# Patient Record
Sex: Female | Born: 1995
Health system: Southern US, Community
[De-identification: ages and names within clinical notes are randomized; demographics above are authoritative.]

## PROBLEM LIST (undated history)

## (undated) DIAGNOSIS — F988 Other specified behavioral and emotional disorders with onset usually occurring in childhood and adolescence: Secondary | ICD-10-CM

## (undated) DIAGNOSIS — G43909 Migraine, unspecified, not intractable, without status migrainosus: Secondary | ICD-10-CM

## (undated) DIAGNOSIS — N946 Dysmenorrhea, unspecified: Secondary | ICD-10-CM

## (undated) DIAGNOSIS — D473 Essential (hemorrhagic) thrombocythemia: Secondary | ICD-10-CM

## (undated) DIAGNOSIS — J45909 Unspecified asthma, uncomplicated: Secondary | ICD-10-CM

## (undated) DIAGNOSIS — D219 Benign neoplasm of connective and other soft tissue, unspecified: Secondary | ICD-10-CM

## (undated) HISTORY — PX: OTHER SURGICAL HISTORY: SHX169

## (undated) HISTORY — DX: Dysmenorrhea, unspecified: N94.6

## (undated) HISTORY — DX: Unspecified asthma, uncomplicated: J45.909

## (undated) HISTORY — PX: ADENOIDECTOMY: SUR15

## (undated) HISTORY — DX: Migraine, unspecified, not intractable, without status migrainosus: G43.909

## (undated) HISTORY — DX: Other specified behavioral and emotional disorders with onset usually occurring in childhood and adolescence: F98.8

## (undated) HISTORY — PX: WISDOM TOOTH EXTRACTION: SHX21

## (undated) HISTORY — PX: TONSILLECTOMY AND ADENOIDECTOMY: SUR1326

## (undated) HISTORY — DX: Benign neoplasm of connective and other soft tissue, unspecified: D21.9

## (undated) HISTORY — DX: Essential (hemorrhagic) thrombocythemia: D47.3

---
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
## 2015-03-06 ENCOUNTER — Encounter: Payer: Self-pay | Admitting: Nurse Practitioner

## 2015-03-06 ENCOUNTER — Ambulatory Visit (INDEPENDENT_AMBULATORY_CARE_PROVIDER_SITE_OTHER): Payer: BLUE CROSS/BLUE SHIELD | Admitting: Nurse Practitioner

## 2015-03-06 VITALS — BP 112/72 | HR 70 | Resp 16 | Ht 60.75 in | Wt 118.0 lb

## 2015-03-06 DIAGNOSIS — Z01419 Encounter for gynecological examination (general) (routine) without abnormal findings: Secondary | ICD-10-CM

## 2015-03-06 DIAGNOSIS — Z0000 Encounter for general adult medical examination without abnormal findings: Secondary | ICD-10-CM | POA: Diagnosis not present

## 2015-03-06 LAB — HEMOGLOBIN, FINGERSTICK: Hemoglobin, fingerstick: 12.6 g/dL (ref 12.0–16.0)

## 2015-03-06 MED ORDER — IBUPROFEN 800 MG PO TABS: 800.0000 mg | ORAL_TABLET | Freq: Three times a day (TID) | ORAL | Status: DC | PRN

## 2015-03-06 NOTE — Patient Instructions (Signed)
General topics  Next pap or exam is  due in 1 year Take a Women's multivitamin Take 1200 mg. of calcium daily - prefer dietary If any concerns in interim to call back  Breast Self-Awareness Practicing breast self-awareness may pick up problems early, prevent significant medical complications, and possibly save your life. By practicing breast self-awareness, you can become familiar with how your breasts look and feel and if your breasts are changing. This allows you to notice changes early. It can also offer you some reassurance that your breast health is good. One way to learn what is normal for your breasts and whether your breasts are changing is to do a breast self-exam. If you find a lump or something that was not present in the past, it is best to contact your caregiver right away. Other findings that should be evaluated by your caregiver include nipple discharge, especially if it is bloody; skin changes or reddening; areas where the skin seems to be pulled in (retracted); or new lumps and bumps. Breast pain is seldom associated with cancer (malignancy), but should also be evaluated by a caregiver. BREAST SELF-EXAM The best time to examine your breasts is 5 7 days after your menstrual period is over.  ExitCare Patient Information 2013 [employer].   Exercise to Stay Healthy Exercise helps you become and stay healthy. EXERCISE IDEAS AND TIPS Choose exercises that:  You enjoy.  Fit into your day. You do not need to exercise really hard to be healthy. You can do exercises at a slow or medium level and stay healthy. You can:  Stretch before and after working out.  Try yoga, Pilates, or tai chi.  Lift weights.  Walk fast, swim, jog, run, climb stairs, bicycle, dance, or rollerskate.  Take aerobic classes. Exercises that burn about 150 calories:  Running 1  miles in 15 minutes.  Playing volleyball for 45 to 60 minutes.  Washing and waxing a car for 45 to 60  minutes.  Playing touch football for 45 minutes.  Walking 1  miles in 35 minutes.  Pushing a stroller 1  miles in 30 minutes.  Playing basketball for 30 minutes.  Raking leaves for 30 minutes.  Bicycling 5 miles in 30 minutes.  Walking 2 miles in 30 minutes.  Dancing for 30 minutes.  Shoveling snow for 15 minutes.  Swimming laps for 20 minutes.  Walking up stairs for 15 minutes.  Bicycling 4 miles in 15 minutes.  Gardening for 30 to 45 minutes.  Jumping rope for 15 minutes.  Washing windows or floors for 45 to 60 minutes. Document Released: 01/16/2011 Document Revised: 03/07/2012 Document Reviewed: 01/16/2011 Exit[employer] Patient Information 2013 [employer].   Other topics ( that may be useful information):    Sexually Transmitted Disease Sexually transmitted disease (STD) refers to any infection that is passed from person to person during sexual activity. This may happen by way of saliva, semen, blood, vaginal mucus, or urine. Common STDs include:  Gonorrhea.  Chlamydia.  Syphilis.  HIV/AIDS.  Genital herpes.  Hepatitis B and C.  Trichomonas.  Human papillomavirus (HPV).  Pubic lice. CAUSES  An STD may be spread by bacteria, virus, or parasite. A person can get an STD by:  Sexual intercourse with an infected person.  Sharing sex toys with an infected person.  Sharing needles with an infected person.  Having intimate contact with the genitals, mouth, or rectal areas of an infected person. SYMPTOMS  Some people may not have any symptoms, but   they can still pass the infection to others. Different STDs have different symptoms. Symptoms include:  Painful or bloody urination.  Pain in the pelvis, abdomen, vagina, anus, throat, or eyes.  Skin rash, itching, irritation, growths, or sores (lesions). These usually occur in the genital or anal area.  Abnormal vaginal discharge.  Penile discharge in men.  Soft, flesh-colored skin growths in the  genital or anal area.  Fever.  Pain or bleeding during sexual intercourse.  Swollen glands in the groin area.  Yellow skin and eyes (jaundice). This is seen with hepatitis. DIAGNOSIS  To make a diagnosis, your caregiver may:  Take a medical history.  Perform a physical exam.  Take a specimen (culture) to be examined.  Examine a sample of discharge under a microscope.  Perform blood test TREATMENT   Chlamydia, gonorrhea, trichomonas, and syphilis can be cured with antibiotic medicine.  Genital herpes, hepatitis, and HIV can be treated, but not cured, with prescribed medicines. The medicines will lessen the symptoms.  Genital warts from HPV can be treated with medicine or by freezing, burning (electrocautery), or surgery. Warts may come back.  HPV is a virus and cannot be cured with medicine or surgery.However, abnormal areas may be followed very closely by your caregiver and may be removed from the cervix, vagina, or vulva through office procedures or surgery. If your diagnosis is confirmed, your recent sexual partners need treatment. This is true even if they are symptom-free or have a negative culture or evaluation. They should not have sex until their caregiver says it is okay. HOME CARE INSTRUCTIONS  All sexual partners should be informed, tested, and treated for all STDs.  Take your antibiotics as directed. Finish them even if you start to feel better.  Only take over-the-counter or prescription medicines for pain, discomfort, or fever as directed by your caregiver.  Rest.  Eat a balanced diet and drink enough fluids to keep your urine clear or pale yellow.  Do not have sex until treatment is completed and you have followed up with your caregiver. STDs should be checked after treatment.  Keep all follow-up appointments, Pap tests, and blood tests as directed by your caregiver.  Only use latex condoms and water-soluble lubricants during sexual activity. Do not use  petroleum jelly or oils.  Avoid alcohol and illegal drugs.  Get vaccinated for HPV and hepatitis. If you have not received these vaccines in the past, talk to your caregiver about whether one or both might be right for you.  Avoid risky sex practices that can break the skin. The only way to avoid getting an STD is to avoid all sexual activity.Latex condoms and dental dams (for oral sex) will help lessen the risk of getting an STD, but will not completely eliminate the risk. SEEK MEDICAL CARE IF:   You have a fever.  You have any new or worsening symptoms. Document Released: 03/06/2003 Document Revised: 03/07/2012 Document Reviewed: 03/13/2011 [facility] Patient Information 2013 [employer].    Domestic Abuse You are being battered or abused if someone close to you hits, pushes, or physically hurts you in any way. You also are being abused if you are forced into activities. You are being sexually abused if you are forced to have sexual contact of any kind. You are being emotionally abused if you are made to feel worthless or if you are constantly threatened. It is important to remember that help is available. No one has the right to abuse you. PREVENTION OF FURTHER  ABUSE  Learn the warning signs of danger. This varies with situations but may include: the use of alcohol, threats, isolation from friends and family, or forced sexual contact. Leave if you feel that violence is going to occur.  If you are attacked or beaten, report it to the police so the abuse is documented. You do not have to press charges. The police can protect you while you or the attackers are leaving. Get the officer's name and badge number and a copy of the report.  Find someone you can trust and tell them what is happening to you: your caregiver, a nurse, clergy member, close friend or family member. Feeling ashamed is natural, but remember that you have done nothing wrong. No one deserves abuse. Document Released:  12/11/2000 Document Revised: 03/07/2012 Document Reviewed: 02/19/2011 [employer] Patient Information 2013 [employer].    How Much is Too Much Alcohol? Drinking too much alcohol can cause injury, accidents, and health problems. These types of problems can include:   Car crashes.  Falls.  Family fighting (domestic violence).  Drowning.  Fights.  Injuries.  Burns.  Damage to certain organs.  Having a baby with birth defects. ONE DRINK CAN BE TOO MUCH WHEN YOU ARE:  Working.  Pregnant or breastfeeding.  Taking medicines. Ask your doctor.  Driving or planning to drive. If you or someone you know has a drinking problem, get help from a doctor.  Document Released: 10/10/2009 Document Revised: 03/07/2012 Document Reviewed: 10/10/2009 Exit[employer] Patient Information 2013 [employer].   Smoking Hazards Smoking cigarettes is extremely bad for your health. Tobacco smoke has over 200 known poisons in it. There are over 60 chemicals in tobacco smoke that cause cancer. Some of the chemicals found in cigarette smoke include:   Cyanide.  Benzene.  Formaldehyde.  Methanol (wood alcohol).  Acetylene (fuel used in welding torches).  Ammonia. Cigarette smoke also contains the poisonous gases nitrogen oxide and carbon monoxide.  Cigarette smokers have an increased risk of many serious medical problems and Smoking causes approximately:  90% of all lung cancer deaths in men.  80% of all lung cancer deaths in women.  90% of deaths from chronic obstructive lung disease. Compared with nonsmokers, smoking increases the risk of:  Coronary heart disease by 2 to 4 times.  Stroke by 2 to 4 times.  Men developing lung cancer by 23 times.  Women developing lung cancer by 13 times.  Dying from chronic obstructive lung diseases by 12 times.  . Smoking is the most preventable cause of death and disease in our society.  WHY IS SMOKING ADDICTIVE?  Nicotine is the chemical  agent in tobacco that is capable of causing addiction or dependence.  When you smoke and inhale, nicotine is absorbed rapidly into the bloodstream through your lungs. Nicotine absorbed through the lungs is capable of creating a powerful addiction. Both inhaled and non-inhaled nicotine may be addictive.  Addiction studies of cigarettes and spit tobacco show that addiction to nicotine occurs mainly during the teen years, when young people begin using tobacco products. WHAT ARE THE BENEFITS OF QUITTING?  There are many health benefits to quitting smoking.   Likelihood of developing cancer and heart disease decreases. Health improvements are seen almost immediately.  Blood pressure, pulse rate, and breathing patterns start returning to normal soon after quitting. QUITTING SMOKING   American Lung Association - 1-800-LUNGUSA  American Cancer Society - 1-800-ACS-2345 Document Released: 01/21/2005 Document Revised: 03/07/2012 Document Reviewed: 09/25/2009 Exit[employer] Patient Information 2013 [employer].   Stress Management Stress is a state of physical or mental tension that often results from changes in your life or normal routine. Some common causes of stress are:  Death of a loved one.  Injuries or severe illnesses.  Getting fired or changing jobs.  Moving into a new home. Other causes may be:  Sexual problems.  Business or financial losses.  Taking on a large debt.  Regular conflict with someone at home or at work.  Constant tiredness from lack of sleep. It is not just bad things that are stressful. It may be stressful to:  Win the lottery.  Get married.  Buy a new car. The amount of stress that can be easily tolerated varies from person to person. Changes generally cause stress, regardless of the types of change. Too much stress can affect your health. It may lead to physical or emotional problems. Too little stress (boredom) may also become stressful. SUGGESTIONS TO  REDUCE STRESS:  Talk things over with your family and friends. It often is helpful to share your concerns and worries. If you feel your problem is serious, you may want to get help from a professional counselor.  Consider your problems one at a time instead of lumping them all together. Trying to take care of everything at once may seem impossible. List all the things you need to do and then start with the most important one. Set a goal to accomplish 2 or 3 things each day. If you expect to do too many in a single day you will naturally fail, causing you to feel even more stressed.  Do not use alcohol or drugs to relieve stress. Although you may feel better for a short time, they do not remove the problems that caused the stress. They can also be habit forming.  Exercise regularly - at least 3 times per week. Physical exercise can help to relieve that "uptight" feeling and will relax you.  The shortest distance between despair and hope is often a good night's sleep.  Go to bed and get up on time allowing yourself time for appointments without being rushed.  Take a short "time-out" period from any stressful situation that occurs during the day. Close your eyes and take some deep breaths. Starting with the muscles in your face, tense them, hold it for a few seconds, then relax. Repeat this with the muscles in your neck, shoulders, hand, stomach, back and legs.  Take good care of yourself. Eat a balanced diet and get plenty of rest.  Schedule time for having fun. Take a break from your daily routine to relax. HOME CARE INSTRUCTIONS   Call if you feel overwhelmed by your problems and feel you can no longer manage them on your own.  Return immediately if you feel like hurting yourself or someone else. Document Released: 06/09/2001 Document Revised: 03/07/2012 Document Reviewed: 01/30/2008 ExitCare Patient Information 2013 [employer].  

## 2015-03-06 NOTE — Progress Notes (Signed)
19 y.o. G0 Single  African American Fe here for NGYN consult with mother who is a pt. here.    Menarche at age 50.   Now worsening cramps for about 3 -4 months.  Menses are 27-29 days.  Lasting 7 days.  Heavy for 3 days using a regular tampon changing every 1-1.5 hours.  Cramps 2 days prior to cycle and no help with OTC NSAID's. Some PMS.  Diagnosed with migraine headaches without aura.  She is much better since on Topamax.  HA's are not always related to menses.  Migraines since junior is HS and has been evaluated by neurologist.  She has never been SA.  This is second year in college and lives in apt. with 3 other females.  Patient's last menstrual period was 02/09/2015.          Sexually active: No.  The current method of family planning is abstinence.    Exercising: No.  exercise Smoker:  no  Health Maintenance: Pap:  none TDaP:  Up to date Gardasil:  Finished age 78 Labs: Hgb-12.6 Self breast exam: not done   reports that she has never smoked. She does not have any smokeless tobacco history on file. She reports that she does not drink alcohol or use illicit drugs.  Past Medical History  Diagnosis Date  . Dysmenorrhea   . Asthma   . Migraines   . ADD (attention deficit disorder) 2013    Past Surgical History  Procedure Laterality Date  . Tonsillectomy and adenoidectomy    . Other surgical history      hematoma surgery on head as an infant  . Wisdom tooth extraction      Current Outpatient Prescriptions  Medication Sig Dispense Refill  . EPINEPHrine 0.3 mg/0.3 mL IJ SOAJ injection     . lansoprazole (PREVACID) 30 MG capsule Take 30 mg by mouth.    . levocetirizine (XYZAL) 5 MG tablet   1  . methylphenidate 18 MG PO CR tablet   0  . montelukast (SINGULAIR) 10 MG tablet   1  . NASONEX 50 MCG/ACT nasal spray   2  . rizatriptan (MAXALT-MLT) 10 MG disintegrating tablet Take 10 mg by mouth.    . topiramate (TOPAMAX) 100 MG tablet Take 100 mg by mouth.    . tretinoin (RETIN-A)  0.025 % cream     . XOPENEX HFA 45 MCG/ACT inhaler   0  . ibuprofen (ADVIL,MOTRIN) 800 MG tablet Take 1 tablet (800 mg total) by mouth every 8 (eight) hours as needed. 30 tablet 2   No current facility-administered medications for this visit.    Family History  Problem Relation Age of Onset  . Diabetes Mother   . Breast cancer Maternal Aunt   . Colon cancer Maternal Grandmother   . Diabetes Maternal Grandmother   . Lung cancer Maternal Grandfather   . Stroke Paternal Grandmother   . Pancreatic cancer Paternal Uncle     ROS:  Pertinent items are noted in HPI.  Otherwise, a comprehensive ROS was negative.  Exam:   BP 112/72 mmHg  Pulse 70  Resp 16  Ht 5' 0.75" (1.543 m)  Wt 118 lb (53.524 kg)  BMI 22.48 kg/m2  LMP 02/09/2015 Height: 5' 0.75" (154.3 cm) Ht Readings from Last 3 Encounters:  03/06/15 5' 0.75" (1.543 m) (8 %*, Z = -1.39)   * Growth percentiles are based on CDC 2-20 Years data.    General appearance: alert, cooperative and appears stated age Neurologic:  Grossly normal  No physical exam at this time. Pelvic:  No pelvic exam is done today    A:  History of dysmenorrhea and PMS  Never SA  History of ADD  P:   Reviewed health and wellness pertinent to exam  Discussed all hormonal ways to resolve menses flow and lessen dysmenorrhea.  We have discussed OCP, Nexplanon, IUD, Nuva ring.  She is not interested in any hormonal intervention at this time.  She is willing to try Motrin 800 mg tid prn for pain #30/2 rf  She is wiling to try increase of Vit B and B complex at CD # 14 -28 for PMS.  If she decided on other intervention or need for birth control then she will return and further discuss best option for her.   Counseled on STD prevention, HIV risk factors and prevention, adequate intake of calcium and vitamin D, diet and exercise return annually or prn  An After Visit Summary was printed and given to the patient.   Consult time with patient and mother at  25 minutes face to face.

## 2015-03-10 NOTE — Progress Notes (Signed)
Encounter reviewed by Dr. Brook Silva.  

## 2015-07-11 DIAGNOSIS — L219 Seborrheic dermatitis, unspecified: Secondary | ICD-10-CM | POA: Insufficient documentation

## 2015-12-18 ENCOUNTER — Ambulatory Visit (INDEPENDENT_AMBULATORY_CARE_PROVIDER_SITE_OTHER): Payer: BLUE CROSS/BLUE SHIELD | Admitting: Allergy and Immunology

## 2015-12-18 ENCOUNTER — Encounter: Payer: Self-pay | Admitting: Allergy and Immunology

## 2015-12-18 VITALS — BP 108/74 | HR 80 | Resp 20

## 2015-12-18 DIAGNOSIS — T7800XD Anaphylactic reaction due to unspecified food, subsequent encounter: Secondary | ICD-10-CM | POA: Diagnosis not present

## 2015-12-18 DIAGNOSIS — J4530 Mild persistent asthma, uncomplicated: Secondary | ICD-10-CM

## 2015-12-18 DIAGNOSIS — J3089 Other allergic rhinitis: Secondary | ICD-10-CM | POA: Diagnosis not present

## 2015-12-18 DIAGNOSIS — T7800XA Anaphylactic reaction due to unspecified food, initial encounter: Secondary | ICD-10-CM | POA: Insufficient documentation

## 2015-12-18 DIAGNOSIS — K219 Gastro-esophageal reflux disease without esophagitis: Secondary | ICD-10-CM | POA: Diagnosis not present

## 2015-12-18 MED ORDER — LEVOCETIRIZINE DIHYDROCHLORIDE 5 MG PO TABS: 5.0000 mg | ORAL_TABLET | Freq: Every day | ORAL | Status: DC

## 2015-12-18 MED ORDER — AZELASTINE-FLUTICASONE 137-50 MCG/ACT NA SUSP: NASAL | Status: DC

## 2015-12-18 MED ORDER — MONTELUKAST SODIUM 10 MG PO TABS: 10.0000 mg | ORAL_TABLET | Freq: Every day | ORAL | Status: DC

## 2015-12-18 MED ORDER — DEXLANSOPRAZOLE 30 MG PO CPDR: 30.0000 mg | DELAYED_RELEASE_CAPSULE | Freq: Every day | ORAL | Status: DC

## 2015-12-18 MED ORDER — LEVALBUTEROL HCL 1.25 MG/3ML IN NEBU: 1.2500 mg | INHALATION_SOLUTION | RESPIRATORY_TRACT | Status: DC | PRN

## 2015-12-18 MED ORDER — EPINEPHRINE 0.3 MG/0.3ML IJ SOAJ: 0.3000 mg | Freq: Once | INTRAMUSCULAR | Status: DC

## 2015-12-18 MED ORDER — XOPENEX HFA 45 MCG/ACT IN AERO: INHALATION_SPRAY | RESPIRATORY_TRACT | Status: DC

## 2015-12-18 NOTE — Assessment & Plan Note (Addendum)
   A prescription has been provided for Dexilant 30 mg daily.  Continue appropriate lifestyle modifications.

## 2015-12-18 NOTE — Patient Instructions (Addendum)
Allergic rhinitis  A sample and prescription have been provided for Dymista (azelastine/fluticasone) nasal spray, 1-2 spray per nostril twice daily as needed. Proper nasal spray technique has been discussed and demonstrated.  Nasal saline lavage (NeilMed) as needed has been recommended along with instructions for proper administration.  Guaifenesin 1200 mg (plus/minus pseudoephedrine 120 mg) twice daily as needed with adequate hydration. Pseudoephedrine is only to be used for short-term relief of nasal/sinus congestion. Long-term use is discouraged due to potential side effects.   Mild persistent asthma Well-controlled.  Continue montelukast 10 mg daily bedtime and levalbuterol every 4-6 hours as needed and 15 minutes prior to vigorous exercise.  Subjective and objective measures of pulmonary function will be followed and the treatment plan will be adjusted accordingly.  GERD (gastroesophageal reflux disease)  A prescription has been provided for Dexilant 30 mg daily.  Continue appropriate lifestyle modifications.  Food allergy  Continue meticulous avoidance of peanuts and have access to epinephrine autoinjector 2 pack in case of accidental ingestion.    Return in about 6 months (around 06/17/2016), or if symptoms worsen or fail to improve.

## 2015-12-18 NOTE — Progress Notes (Signed)
RE: NAEEMAH Simpson MRN: [id number] DOB: [birth year] [facility] [place] [address] [place] [address] Date of Office Visit: 12/18/2015  Referring provider: Gavin Pound, MD [place], [place] [address]  History of present illness: HPI Comments: Stephanie Simpson is a 19 y.o. female With persistent asthma, allergic rhinitis, gastroesophageal reflux, and food allergies who presents today for follow up.  She complains of frequent nasal congestion, sinus pressure, and postnasal drainage.  She states that these symptoms used to only be problematic in the springtime in the fall, however now they occur year around.  The nasal and sinus symptoms seem to be worse when she wakes up in the morning.  She is compliant with multiple medications and was on aeroallergen immunotherapy for 7 years.  She reports that her asthma has been well controlled.  Over the past several months she has not required rescue medication, experienced nocturnal awakenings due to lower respiratory symptoms, nor have activities of daily living been limited.  She avoids peanuts and has access to epinephrine autoinjector.  She has been experiencing reflux symptoms approximately every other day despite compliance with her proton pump inhibitor and appropriate lifestyle modifications.   Assessment and plan: Allergic rhinitis  A sample and prescription have been provided for Dymista (azelastine/fluticasone) nasal spray, 1-2 spray per nostril twice daily as needed. Proper nasal spray technique has been discussed and demonstrated.  Nasal saline lavage (NeilMed) as needed has been recommended along with instructions for proper administration.  Guaifenesin 1200 mg (plus/minus pseudoephedrine 120 mg) twice daily as needed with adequate hydration. Pseudoephedrine is only to be used for short-term relief of nasal/sinus congestion. Long-term use is discouraged due to potential  side effects.   GERD (gastroesophageal reflux disease)  A prescription has been provided for Dexilant 30 mg daily.  Continue appropriate lifestyle modifications.  Mild persistent asthma Well-controlled.  Continue montelukast 10 mg daily bedtime and levalbuterol every 4-6 hours as needed and 15 minutes prior to vigorous exercise.  Subjective and objective measures of pulmonary function will be followed and the treatment plan will be adjusted accordingly.  Food allergy  Continue meticulous avoidance of peanuts and have access to epinephrine autoinjector 2 pack in case of accidental ingestion.    Medications ordered this encounter: Meds ordered this encounter  Medications  . EPINEPHrine 0.3 mg/0.3 mL IJ SOAJ injection    Sig: Inject 0.3 mLs (0.3 mg total) into the muscle once. As needed for severe life-threatening allergic reaction    Dispense:  4 Device    Refill:  2  . levocetirizine (XYZAL) 5 MG tablet    Sig: Take 1 tablet (5 mg total) by mouth daily.    Dispense:  90 tablet    Refill:  1  . montelukast (SINGULAIR) 10 MG tablet    Sig: Take 1 tablet (10 mg total) by mouth at bedtime.    Dispense:  90 tablet    Refill:  1  . XOPENEX HFA 45 MCG/ACT inhaler    Sig: Every 4-6 hours as needed for cough or wheeze.    Dispense:  2 Inhaler    Refill:  1  . levalbuterol (XOPENEX) 1.25 MG/3ML nebulizer solution    Sig: Take 1.25 mg by nebulization every 4 (four) hours as needed for wheezing.    Dispense:  75 mL    Refill:  1  . Azelastine-Fluticasone 137-50 MCG/ACT SUSP    Sig: 1-2 sprays in each  nostril twice daily for stuffy nose or congestion    Dispense:  1 Bottle    Refill:  0  . Azelastine-Fluticasone 137-50 MCG/ACT SUSP    Sig: 1-2 sprays in each nostril twice daily for stuffy nose or congestion    Dispense:  3 Bottle    Refill:  1  . Dexlansoprazole (DEXILANT) 30 MG capsule    Sig: Take 1 capsule (30 mg total) by mouth daily.    Dispense:  30 capsule    Refill:   0  . Dexlansoprazole (DEXILANT) 30 MG capsule    Sig: Take 1 capsule (30 mg total) by mouth daily.    Dispense:  90 capsule    Refill:  1    Diagnositics: Spirometry reveals an FVC of 3.59 L and an FEV1 of 2.67 L (109% predicted).    Physical examination: Blood pressure 108/74, pulse 80, resp. rate 20.  General: Alert, interactive, in no acute distress. HEENT: TMs pearly gray, turbinates moderately edematous without discharge, post-pharynx moderately erythematous. Neck: Supple without lymphadenopathy. Lungs: Clear to auscultation without wheezing, rhonchi or rales. CV: Normal S1, S2 without murmurs. Skin: Warm and dry, without lesions or rashes.  The following portions of the patient's history were reviewed and updated as appropriate: allergies, current medications, past family history, past medical history, past social history, past surgical history and problem list.  Outpatient medications:   Medication List       This list is accurate as of: 12/18/15  6:57 PM.  Always use your most recent med list.               Azelastine-Fluticasone 137-50 MCG/ACT Susp  1-2 sprays in each nostril twice daily for stuffy nose or congestion     Azelastine-Fluticasone 137-50 MCG/ACT Susp  1-2 sprays in each nostril twice daily for stuffy nose or congestion     Dexlansoprazole 30 MG capsule  Commonly known as:  DEXILANT  Take 1 capsule (30 mg total) by mouth daily.     Dexlansoprazole 30 MG capsule  Commonly known as:  DEXILANT  Take 1 capsule (30 mg total) by mouth daily.     EPINEPHrine 0.3 mg/0.3 mL Soaj injection  Commonly known as:  EPI-PEN  Inject 0.3 mLs (0.3 mg total) into the muscle once. As needed for severe life-threatening allergic reaction     ibuprofen 800 MG tablet  Commonly known as:  ADVIL,MOTRIN  Take 1 tablet (800 mg total) by mouth every 8 (eight) hours as needed.     lansoprazole 30 MG capsule  Commonly known as:  PREVACID  Take 30 mg by mouth.      levalbuterol 1.25 MG/3ML nebulizer solution  Commonly known as:  XOPENEX  Take 1 ampule by nebulization every 4 (four) hours as needed.     levalbuterol 1.25 MG/3ML nebulizer solution  Commonly known as:  XOPENEX  Take 1.25 mg by nebulization every 4 (four) hours as needed for wheezing.     levocetirizine 5 MG tablet  Commonly known as:  XYZAL  Take 1 tablet (5 mg total) by mouth daily.     methylphenidate 18 MG CR tablet  Commonly known as:  CONCERTA  Take 18 mg by mouth daily.     montelukast 10 MG tablet  Commonly known as:  SINGULAIR  Take 1 tablet (10 mg total) by mouth at bedtime.     NASONEX 50 MCG/ACT nasal spray  Generic drug:  mometasone  Place 1 spray into the nose 2 (two) times  daily.     rizatriptan 10 MG disintegrating tablet  Commonly known as:  MAXALT-MLT  Take 10 mg by mouth as needed for migraine.     topiramate 100 MG tablet  Commonly known as:  TOPAMAX  Take 100 mg by mouth daily.     tretinoin 0.025 % cream  Commonly known as:  RETIN-A  Reported on 12/18/2015     XOPENEX HFA 45 MCG/ACT inhaler  Generic drug:  levalbuterol  Every 4-6 hours as needed for cough or wheeze.        Known medication allergies: Allergies  Allergen Reactions  . Amoxicillin-Pot Clavulanate Hives  . Doxycycline Hives  . Peanut Oil     Unsure, told she was allergic per allergist  . Peanut-Containing Drug Products    Review of systems: Constitutional: Negative for fever, chills and weight loss.  HENT: Negative for nosebleeds.   Positive for nasal congestion and sinus pressure. Eyes: Negative for blurred vision.  Respiratory: Negative for hemoptysis.   Cardiovascular: Negative for chest pain.  Gastrointestinal: Negative for diarrhea and constipation.  Genitourinary: Negative for dysuria.  Musculoskeletal: Negative for myalgias and joint pain.  Neurological: Negative for dizziness.  Endo/Heme/Allergies: Does not bruise/bleed easily.   Past Medical History   Diagnosis Date  . Dysmenorrhea   . Asthma   . Migraines   . ADD (attention deficit disorder) 2013    Family History  Problem Relation Age of Onset  . Diabetes Mother   . Breast cancer Maternal Aunt   . Colon cancer Maternal Grandmother   . Diabetes Maternal Grandmother   . Lung cancer Maternal Grandfather   . Stroke Paternal Grandmother   . Pancreatic cancer Paternal Uncle     Social History   Social History  . Marital Status: Single    Spouse Name: N/A  . Number of Children: N/A  . Years of Education: N/A   Occupational History  . Not on file.   Social History Main Topics  . Smoking status: Never Smoker   . Smokeless tobacco: Never Used  . Alcohol Use: No  . Drug Use: No  . Sexual Activity: No     Comment: never sexually active   Other Topics Concern  . Not on file   Social History Narrative    I appreciate the opportunity to take part in this Shaquana's care. Please do not hesitate to contact me with questions.  Sincerely,   R. Edgar Frisk, MD

## 2015-12-18 NOTE — Assessment & Plan Note (Signed)
Well-controlled.  Continue montelukast 10 mg daily bedtime and levalbuterol every 4-6 hours as needed and 15 minutes prior to vigorous exercise.  Subjective and objective measures of pulmonary function will be followed and the treatment plan will be adjusted accordingly.

## 2015-12-18 NOTE — Assessment & Plan Note (Signed)
   A sample and prescription have been provided for Dymista (azelastine/fluticasone) nasal spray, 1-2 spray per nostril twice daily as needed. Proper nasal spray technique has been discussed and demonstrated.  Nasal saline lavage (NeilMed) as needed has been recommended along with instructions for proper administration.  Guaifenesin 1200 mg (plus/minus pseudoephedrine 120 mg) twice daily as needed with adequate hydration. Pseudoephedrine is only to be used for short-term relief of nasal/sinus congestion. Long-term use is discouraged due to potential side effects.

## 2015-12-18 NOTE — Assessment & Plan Note (Signed)
   Continue meticulous avoidance of peanuts and have access to epinephrine autoinjector 2 pack in case of accidental ingestion.

## 2015-12-26 ENCOUNTER — Telehealth: Payer: Self-pay

## 2015-12-26 NOTE — Telephone Encounter (Signed)
Spoke with Mom Mariann Laster) and Stephanie Simpson (patient).  They states Keydi has tried and failed Nasonex, Flonase and Saline.

## 2015-12-26 NOTE — Telephone Encounter (Signed)
Called patient to find out what she has tried and failed to complete Dymista Prior Authorization.  Mom and Patient states has used Nasonex, Flonase, and Saline.

## 2015-12-26 NOTE — Telephone Encounter (Signed)
Tried to call Patient to see what nasal sprays she has tried and failed so I can complete a Prior Authorization for Dymista.  No answer.  Left message to call the office.

## 2016-01-24 ENCOUNTER — Other Ambulatory Visit: Payer: Self-pay | Admitting: Nurse Practitioner

## 2016-01-24 MED ORDER — IBUPROFEN 800 MG PO TABS: 800.0000 mg | ORAL_TABLET | Freq: Three times a day (TID) | ORAL | Status: DC | PRN

## 2016-03-03 ENCOUNTER — Ambulatory Visit: Payer: BLUE CROSS/BLUE SHIELD | Admitting: Nurse Practitioner

## 2016-04-19 ENCOUNTER — Other Ambulatory Visit: Payer: Self-pay | Admitting: Allergy and Immunology

## 2016-05-11 ENCOUNTER — Ambulatory Visit (INDEPENDENT_AMBULATORY_CARE_PROVIDER_SITE_OTHER): Payer: Managed Care, Other (non HMO) | Admitting: Pediatrics

## 2016-05-11 ENCOUNTER — Encounter: Payer: Self-pay | Admitting: Pediatrics

## 2016-05-11 VITALS — BP 110/68 | HR 80 | Temp 97.4°F | Resp 18

## 2016-05-11 DIAGNOSIS — T7800XD Anaphylactic reaction due to unspecified food, subsequent encounter: Secondary | ICD-10-CM

## 2016-05-11 DIAGNOSIS — J4530 Mild persistent asthma, uncomplicated: Secondary | ICD-10-CM

## 2016-05-11 DIAGNOSIS — K219 Gastro-esophageal reflux disease without esophagitis: Secondary | ICD-10-CM | POA: Insufficient documentation

## 2016-05-11 DIAGNOSIS — J3089 Other allergic rhinitis: Secondary | ICD-10-CM

## 2016-05-11 MED ORDER — LEVALBUTEROL HCL 1.25 MG/3ML IN NEBU: 1.2500 mg | INHALATION_SOLUTION | RESPIRATORY_TRACT | Status: DC | PRN

## 2016-05-11 MED ORDER — MONTELUKAST SODIUM 10 MG PO TABS: 10.0000 mg | ORAL_TABLET | Freq: Every day | ORAL | Status: DC

## 2016-05-11 MED ORDER — LEVOCETIRIZINE DIHYDROCHLORIDE 5 MG PO TABS: 5.0000 mg | ORAL_TABLET | Freq: Every day | ORAL | Status: DC

## 2016-05-11 MED ORDER — XOPENEX HFA 45 MCG/ACT IN AERO: INHALATION_SPRAY | RESPIRATORY_TRACT | Status: DC

## 2016-05-11 MED ORDER — PANTOPRAZOLE SODIUM 40 MG PO TBEC: DELAYED_RELEASE_TABLET | ORAL | Status: DC

## 2016-05-11 MED ORDER — EPINEPHRINE 0.3 MG/0.3ML IJ SOAJ: INTRAMUSCULAR | Status: DC

## 2016-05-11 MED ORDER — EPINEPHRINE 0.15 MG/0.3ML IJ SOAJ: INTRAMUSCULAR | Status: DC

## 2016-05-11 MED ORDER — FLUTICASONE PROPIONATE 50 MCG/ACT NA SUSP: NASAL | Status: DC

## 2016-05-11 NOTE — Progress Notes (Signed)
[address] [place] [address] Dept: [phone]  FOLLOW UP NOTE  Patient ID: Stephanie Simpson, female    DOB: 10-Apr-1996  Age: 20 y.o. MRN: [id number] Date of Office Visit: 05/11/2016  Assessment Chief Complaint: Sneezing; Nasal Congestion; and Eyes itch and water  HPI Reigna J Booze presents for follow-up of asthma, allergic rhinitis and food allergies. During the springtime the pollen has been bothering her greatly.. She is not on allergy injections any longer. She cannot afford the co-pay of Dymista ror Dexilant. She continues to avoid peanuts  Current medications are Benadryl and EpiPen 0.3 mg in case of an allergic reaction to peanut, montelukast  10 mg once a day, levocetirizine 5 mg once a day, Xopenex 2 puffs every 6 hours if needed or instead Xopenex 1.25 mg one unit dose every 6 hours if needed, Dexilant 30 mg once a day, Dymista one spray per nostril twice a day. Her other medications are outlined in the chart    Drug Allergies:  Allergies  Allergen Reactions  . Amoxicillin-Pot Clavulanate Hives  . Doxycycline Hives  . Peanut Oil     Unsure, told she was allergic per allergist  . Peanut-Containing Drug Products     Physical Exam: BP 110/68 mmHg  Pulse 80  Temp(Src) 97.4 F (36.3 C) (Oral)  Resp 18   Physical Exam  Constitutional: She is oriented to person, place, and time. She appears well-developed and well-nourished.  HENT:  Eyes normal. Ears normal. Nose mild swelling of nasal  turbinates with clear nasal discharge. Pharynx normal.  Neck: Neck supple.  Cardiovascular:  S1 and S2 normal no murmurs  Pulmonary/Chest:  Clear to percussion and auscultation  Lymphadenopathy:    She has no cervical adenopathy.  Neurological: She is alert and oriented to person, place, and time.  Skin:  Clear  Psychiatric: She has a normal mood and affect. Her behavior is normal. Judgment and thought content normal.  Vitals reviewed.   Diagnostics:  FVC 3.20 L FEV1  2.65 L. Predicted FVC 2.74 L predicted FEV1 2.44 L-the spirometry is in the normal range  Assessment and Plan: 1. Mild persistent asthma, uncomplicated   2. Other allergic rhinitis   3. Allergy with anaphylaxis due to food, subsequent encounter   4. Gastroesophageal reflux disease without esophagitis     Meds ordered this encounter  Medications  . DISCONTD: fluticasone (FLONASE) 50 MCG/ACT nasal spray    Sig: TWO SPRAYS EACH NOSTRIL ONCE A DAY FOR NASAL CONGESTION    Dispense:  48 g    Refill:  2  . DISCONTD: pantoprazole (PROTONIX) 40 MG tablet    Sig: ONE TABLET ONCE A DAY FOR REFLUX    Dispense:  90 tablet    Refill:  1  . DISCONTDPenne Lash HFA 45 MCG/ACT inhaler    Sig: Every 4-6 hours as needed for cough or wheeze.    Dispense:  3 Inhaler    Refill:  1  . DISCONTD: levalbuterol (XOPENEX) 1.25 MG/3ML nebulizer solution    Sig: Take 1.25 mg by nebulization every 4 (four) hours as needed for wheezing.    Dispense:  225 mL    Refill:  1  . DISCONTD: levocetirizine (XYZAL) 5 MG tablet    Sig: Take 1 tablet (5 mg total) by mouth daily.    Dispense:  90 tablet    Refill:  1  . DISCONTD: montelukast (SINGULAIR) 10 MG tablet    Sig: Take 1 tablet (10 mg total) by mouth  at bedtime.    Dispense:  90 tablet    Refill:  1  . DISCONTD: EPINEPHrine (EPIPEN JR 2-PAK) 0.15 MG/0.3ML injection    Sig: USE AS DIRECTED FOR SEVERE ALLERGIC REACTION.    Dispense:  3 each    Refill:  1  . fluticasone (FLONASE) 50 MCG/ACT nasal spray    Sig: TWO SPRAYS EACH NOSTRIL ONCE A DAY FOR NASAL CONGESTION    Dispense:  16 g    Refill:  0  . levalbuterol (XOPENEX) 1.25 MG/3ML nebulizer solution    Sig: Take 1.25 mg by nebulization every 4 (four) hours as needed for wheezing.    Dispense:  75 mL    Refill:  0  . levocetirizine (XYZAL) 5 MG tablet    Sig: Take 1 tablet (5 mg total) by mouth daily.    Dispense:  30 tablet    Refill:  0  . montelukast (SINGULAIR) 10 MG tablet    Sig: Take 1 tablet  (10 mg total) by mouth at bedtime.    Dispense:  30 tablet    Refill:  0  . pantoprazole (PROTONIX) 40 MG tablet    Sig: ONE TABLET ONCE A DAY FOR REFLUX    Dispense:  30 tablet    Refill:  0  . EPINEPHrine (EPIPEN 2-PAK) 0.3 mg/0.3 mL IJ SOAJ injection    Sig: USE AS DIRECTED FOR SEVERE ALLERGIC REACTION.    Dispense:  2 Device    Refill:  0    PT WILL CALL FOR FILL.  Marland Kitchen XOPENEX HFA 45 MCG/ACT inhaler    Sig: TWO PUFFS EVERY 6 HOURS IF NEEDED FOR COUGH OR WHEEZE.    Dispense:  1 Inhaler    Refill:  0    Patient Instructions  Zyrtec 10 mg once a day for runny nose or itchy eyes Fluticasone 2 sprays per nostril once a day for stuffy nose Pantoprazole 40 mg-one tablet once a day for reflux Xopenex 2 puffs every 6 hours if needed for coughing or wheezing Xopenex 1.25 mg per 3 ML-take one unit dose every  dose  6 hours if needed for coughing or wheezing Levocetirizine 5 mg once a day for runny nose Montelukast 10 mg once a day for coughing or wheezing Stop Dexilant and Dymista   Avoid peanuts. If she has an allergic reaction give Benadryl 50 mg every 4 hours and if she has life-threatening symptoms inject with EpiPen 0.3 mg    Return in about 6 months (around 11/11/2016).    Thank you for the opportunity to care for this patient.  Please do not hesitate to contact me with questions.  Penne Lash, M.D.  [facility] [address] [place], [place] [address] [phone]

## 2016-05-11 NOTE — Patient Instructions (Addendum)
Zyrtec 10 mg once a day for runny nose or itchy eyes Fluticasone 2 sprays per nostril once a day for stuffy nose Pantoprazole 40 mg-one tablet once a day for reflux Xopenex 2 puffs every 6 hours if needed for coughing or wheezing Xopenex 1.25 mg per 3 ML-take one unit dose every  dose  6 hours if needed for coughing or wheezing Levocetirizine 5 mg once a day for runny nose Montelukast 10 mg once a day for coughing or wheezing Stop Dexilant and Dymista   Avoid peanuts. If she has an allergic reaction give Benadryl 50 mg every 4 hours and if she has life-threatening symptoms inject with EpiPen 0.3 mg

## 2016-05-14 ENCOUNTER — Telehealth: Payer: Self-pay | Admitting: *Deleted

## 2016-05-14 ENCOUNTER — Other Ambulatory Visit: Payer: Self-pay | Admitting: Allergy

## 2016-05-14 MED ORDER — ALBUTEROL SULFATE HFA 108 (90 BASE) MCG/ACT IN AERS: 2.0000 | INHALATION_SPRAY | RESPIRATORY_TRACT | Status: DC | PRN

## 2016-05-14 NOTE — Telephone Encounter (Signed)
CALLED IN [facility]. AND LEFT MESSAGE FOR PATIENT

## 2016-05-14 NOTE — Telephone Encounter (Signed)
Call patient. Let her know that her insurance will not pay for Xopenex . We could try Pro-air 2 puffs every 4 hours if needed.

## 2016-05-14 NOTE — Telephone Encounter (Signed)
Pharmacy called and states insurance will not pay for Xopenex. Insurance will cover Ventolin HFA, ProAir HFA, ProAir Respclick. Please advise

## 2016-05-16 ENCOUNTER — Other Ambulatory Visit: Payer: Self-pay | Admitting: Allergy and Immunology

## 2016-05-28 ENCOUNTER — Telehealth: Payer: Self-pay | Admitting: Pediatrics

## 2016-05-28 NOTE — Telephone Encounter (Signed)
Her mother called with concerns about the charge for laboratory and wanted to know why the office visit charge was so much. If you call her and she doesn't answer please call her back tomorrow.

## 2016-10-08 ENCOUNTER — Ambulatory Visit (INDEPENDENT_AMBULATORY_CARE_PROVIDER_SITE_OTHER): Payer: BC Managed Care – PPO | Admitting: Allergy and Immunology

## 2016-10-08 ENCOUNTER — Encounter: Payer: Self-pay | Admitting: Allergy and Immunology

## 2016-10-08 VITALS — BP 110/70 | HR 68 | Temp 98.4°F | Resp 16 | Ht 60.83 in | Wt 118.2 lb

## 2016-10-08 DIAGNOSIS — Z23 Encounter for immunization: Secondary | ICD-10-CM

## 2016-10-08 DIAGNOSIS — J4530 Mild persistent asthma, uncomplicated: Secondary | ICD-10-CM | POA: Diagnosis not present

## 2016-10-08 DIAGNOSIS — K219 Gastro-esophageal reflux disease without esophagitis: Secondary | ICD-10-CM

## 2016-10-08 DIAGNOSIS — J3089 Other allergic rhinitis: Secondary | ICD-10-CM

## 2016-10-08 DIAGNOSIS — T7800XD Anaphylactic reaction due to unspecified food, subsequent encounter: Secondary | ICD-10-CM | POA: Diagnosis not present

## 2016-10-08 MED ORDER — BECLOMETHASONE DIPROPIONATE 80 MCG/ACT IN AERS
2.0000 | INHALATION_SPRAY | Freq: Two times a day (BID) | RESPIRATORY_TRACT | 5 refills | Status: DC
Start: 2016-10-08 — End: 2019-03-02

## 2016-10-08 NOTE — Assessment & Plan Note (Signed)
   Continue appropriate reflux lifestyle modifications and proton pump inhibitor as prescribed.

## 2016-10-08 NOTE — Patient Instructions (Addendum)
Mild persistent asthma  Continue montelukast 10 mg daily bedtime and albuterol HFA, 1-2 inhalations every 4-6 hours as needed.  During respiratory tract infections or asthma flares, add Qvar 80 g  to 2 inhalations 2 times per day until symptoms have returned to baseline.To maximize pulmonary deposition, a spacer has been provided along with instructions for its proper administration with an HFA inhaler.  Influenza vaccination was administered today.  Subjective and objective measures of pulmonary function will be followed and the treatment plan will be adjusted accordingly.  Allergic rhinitis  Continue appropriate allergen avoidance measures and Dymista, 1-2 sprays twice a day.  Nasal saline lavage (NeilMed) as needed has been recommended along with instructions for proper administration.  For thick post nasal drainage, add guaifenesin 1200 mg (Mucinex Maximum Strength)  twice daily as needed with adequate hydration as discussed.  If allergen avoidance measures and medications fail to adequately relieve symptoms, aeroallergen immunotherapy will be considered.  GERD (gastroesophageal reflux disease)  Continue appropriate reflux lifestyle modifications and proton pump inhibitor as prescribed.   Return in about 4 months (around 02/08/2017), or if symptoms worsen or fail to improve.

## 2016-10-08 NOTE — Progress Notes (Signed)
Follow-up Note  RE: Stephanie Simpson MRN: [id number] DOB: Sep 25, 1996 Date of Office Visit: 10/08/2016  Primary care provider: Gavin Pound, MD Referring provider: Gavin Pound, MD  History of present illness: Stephanie Simpson is a 20 y.o. female with persistent asthma, allergic rhinitis, and food allergy presenting today for sick visit.  She was last seen in this clinic on 05/11/2016.  She reports that approximately 3 weeks ago she developed an upper respiratory tract infection which turned into bronchitis/mild asthma exacerbation.  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  She reports that her symptoms have improved overall, however she is still experiencing postnasal drainage a mild lingering cough.  She has no complaints related acid reflux today.   Assessment and plan: Mild persistent asthma  Continue montelukast 10 mg daily bedtime and albuterol HFA, 1-2 inhalations every 4-6 hours as needed.  During respiratory tract infections or asthma flares, add Qvar 80 g  to 2 inhalations 2 times per day until symptoms have returned to baseline.To maximize pulmonary deposition, a spacer has been provided along with instructions for its proper administration with an HFA inhaler.  Influenza vaccination was administered today.  Subjective and objective measures of pulmonary function will be followed and the treatment plan will be adjusted accordingly.  Allergic rhinitis  Continue appropriate allergen avoidance measures and Dymista, 1-2 sprays twice a day.  Nasal saline lavage (NeilMed) as needed has been recommended along with instructions for proper administration.  For thick post nasal drainage, add guaifenesin 1200 mg (Mucinex Maximum Strength)  twice daily as needed with adequate hydration as discussed.  If allergen avoidance measures and medications fail to adequately relieve symptoms, aeroallergen immunotherapy will be  considered.  GERD (gastroesophageal reflux disease)  Continue appropriate reflux lifestyle modifications and proton pump inhibitor as prescribed.   Meds ordered this encounter  Medications  . beclomethasone (QVAR) 80 MCG/ACT inhaler    Sig: Inhale 2 puffs into the lungs 2 (two) times daily.    Dispense:  1 Inhaler    Refill:  5    Diagnostics: Spirometry:  Normal with an FEV1 of 101% predicted.  Please see scanned spirometry results for details.    Physical examination: Blood pressure 110/70, pulse 68, temperature 98.4 F (36.9 C), temperature source Oral, resp. rate 16, height 5' 0.83" (1.545 m), weight 118 lb 2.7 oz (53.6 kg), SpO2 97 %.  General: Alert, interactive, in no acute distress. HEENT: TMs pearly gray, turbinates moderately edematous without discharge, post-pharynx mildly erythematous. Neck: Supple without lymphadenopathy. Lungs: Clear to auscultation without wheezing, rhonchi or rales. CV: Normal S1, S2 without murmurs. Skin: Warm and dry, without lesions or rashes.  The following portions of the patient's history were reviewed and updated as appropriate: allergies, current medications, past family history, past medical history, past social history, past surgical history and problem list.    Medication List       Accurate as of 10/08/16  3:41 PM. Always use your most recent med list.          beclomethasone 80 MCG/ACT inhaler Commonly known as:  QVAR Inhale 2 puffs into the lungs 2 (two) times daily.   clindamycin-benzoyl peroxide gel Commonly known as:  BENZACLIN Apply to face daily to twice daily   DYMISTA 137-50 MCG/ACT Susp Generic drug:  Azelastine-Fluticasone INHALE 1 TO 2 SPRAYS IN EACH NOSTRIL TWICE DAILY FOR STUFFY NOSE OR CONGESTION   EPIPEN 2-PAK 0.3 mg/0.3 mL Soaj injection Generic  drug:  EPINEPHrine INJECT INTRAMUSCULARLY AS DIRECTED FOR SEVERE LIFE-THREATENING ALLERGIC REACTION.   frovatriptan 2.5 MG tablet Commonly known as:   FROVA Take by mouth.   ibuprofen 800 MG tablet Commonly known as:  ADVIL,MOTRIN Take 1 tablet (800 mg total) by mouth every 8 (eight) hours as needed.   lansoprazole 30 MG capsule Commonly known as:  PREVACID Take 30 mg by mouth. Reported on 05/11/2016   levalbuterol 1.25 MG/0.5ML nebulizer solution Commonly known as:  XOPENEX Take 1.25 mg by nebulization every 4 (four) hours as needed for wheezing or shortness of breath.   levalbuterol 45 MCG/ACT inhaler Commonly known as:  XOPENEX HFA Inhale into the lungs.   levocetirizine 5 MG tablet Commonly known as:  XYZAL Take 1 tablet (5 mg total) by mouth daily.   methylphenidate 27 MG CR tablet Commonly known as:  CONCERTA Take by mouth.   montelukast 10 MG tablet Commonly known as:  SINGULAIR TK 1 T PO ONCE D   zonisamide 100 MG capsule Commonly known as:  ZONEGRAN Take by mouth.       Allergies  Allergen Reactions  . Amoxicillin-Pot Clavulanate Hives and Other (See Comments)    No reaction noted  . Doxycycline Hives and Other (See Comments)    HIVES  . Peanut Oil Other (See Comments)    No reaction noted Unsure, told she was allergic per allergist  . Peanut-Containing Drug Products    Review of systems: Review of systems negative except as noted in HPI / PMHx or noted below: Constitutional: Negative.  HENT: Negative.   Eyes: Negative.  Respiratory: Negative.   Cardiovascular: Negative.  Gastrointestinal: Negative.  Genitourinary: Negative.  Musculoskeletal: Negative.  Neurological: Negative.  Endo/Heme/Allergies: Negative.  Cutaneous: Negative.  Past Medical History:  Diagnosis Date  . ADD (attention deficit disorder) 2013  . Asthma   . Dysmenorrhea   . Migraines     Family History  Problem Relation Age of Onset  . Diabetes Mother   . Colon cancer Maternal Grandmother   . Diabetes Maternal Grandmother   . Lung cancer Maternal Grandfather   . Stroke Paternal Grandmother   . Breast cancer  Maternal Aunt   . Pancreatic cancer Paternal Uncle     Social History   Social History  . Marital status: Single    Spouse name: N/A  . Number of children: N/A  . Years of education: N/A   Occupational History  . Not on file.   Social History Main Topics  . Smoking status: Never Smoker  . Smokeless tobacco: Never Used  . Alcohol use No  . Drug use: No  . Sexual activity: No     Comment: never sexually active   Other Topics Concern  . Not on file   Social History Narrative  . No narrative on file    I appreciate the opportunity to take part in Christena's care. Please do not hesitate to contact me with questions.  Sincerely,   R. Edgar Frisk, MD

## 2016-10-08 NOTE — Assessment & Plan Note (Addendum)
   Continue montelukast 10 mg daily bedtime and albuterol HFA, 1-2 inhalations every 4-6 hours as needed.  During respiratory tract infections or asthma flares, add Qvar 80 g  to 2 inhalations 2 times per day until symptoms have returned to baseline.To maximize pulmonary deposition, a spacer has been provided along with instructions for its proper administration with an HFA inhaler.  Influenza vaccination was administered today.  Subjective and objective measures of pulmonary function will be followed and the treatment plan will be adjusted accordingly.

## 2016-10-08 NOTE — Assessment & Plan Note (Addendum)
   Continue appropriate allergen avoidance measures and Dymista, 1-2 sprays twice a day.  Nasal saline lavage (NeilMed) as needed has been recommended along with instructions for proper administration.  For thick post nasal drainage, add guaifenesin 1200 mg (Mucinex Maximum Strength)  twice daily as needed with adequate hydration as discussed.  If allergen avoidance measures and medications fail to adequately relieve symptoms, aeroallergen immunotherapy will be considered.

## 2016-12-07 ENCOUNTER — Encounter: Payer: Self-pay | Admitting: Pediatrics

## 2016-12-07 ENCOUNTER — Ambulatory Visit (INDEPENDENT_AMBULATORY_CARE_PROVIDER_SITE_OTHER): Payer: BC Managed Care – PPO | Admitting: Pediatrics

## 2016-12-07 VITALS — BP 130/78 | HR 72 | Temp 98.5°F | Resp 24

## 2016-12-07 DIAGNOSIS — J4540 Moderate persistent asthma, uncomplicated: Secondary | ICD-10-CM | POA: Diagnosis not present

## 2016-12-07 DIAGNOSIS — T7800XD Anaphylactic reaction due to unspecified food, subsequent encounter: Secondary | ICD-10-CM | POA: Diagnosis not present

## 2016-12-07 DIAGNOSIS — K219 Gastro-esophageal reflux disease without esophagitis: Secondary | ICD-10-CM | POA: Diagnosis not present

## 2016-12-07 DIAGNOSIS — J3089 Other allergic rhinitis: Secondary | ICD-10-CM

## 2016-12-07 MED ORDER — LANSOPRAZOLE 30 MG PO CPDR
30.0000 mg | DELAYED_RELEASE_CAPSULE | Freq: Every day | ORAL | 3 refills | Status: DC
Start: 2016-12-07 — End: 2017-07-26

## 2016-12-07 MED ORDER — LEVOCETIRIZINE DIHYDROCHLORIDE 5 MG PO TABS: 5.0000 mg | ORAL_TABLET | Freq: Every day | ORAL | 3 refills | Status: DC

## 2016-12-07 MED ORDER — MONTELUKAST SODIUM 10 MG PO TABS: 10.0000 mg | ORAL_TABLET | Freq: Every day | ORAL | 3 refills | Status: DC

## 2016-12-07 NOTE — Patient Instructions (Signed)
Continue on the current treatment plan Call me if you're not doing well on this treatment plan

## 2016-12-07 NOTE — Progress Notes (Signed)
[place] [id number] Dept: [phone]  FOLLOW UP NOTE  Patient ID: Stephanie Simpson, female    DOB: [birth year]  Age: 20 y.o. MRN: [id number] Date of Office Visit: 12/07/2016  Assessment  Chief Complaint: Allergic Rhinitis  (doing good) and Asthma (doing good)  HPI [facility] presents for follow-up of asthma and allergic rhinitis. Her asthma is well controlled. She has had a flu vaccination. Her nasal symptoms are well controlled. She is a Equities trader in college. She  has not an had allergic reactions to foods since the last visit. She avoids peanuts.  Current medications are Qvar 80-2 puffs once a day, Xopenex 2 puffs every 6 hours if needed or instead Xopenex 1.25 mg every 6 hours if needed, levocetirizine 5 mg once a day, Dymista one or 2 sprays per nostril twice a day , montelukast 10 mg once a day  Lansoprazole 30 mg once a day, and Benadryl and EpiPen in case of an allergic reaction   Drug Allergies:  Allergies  Allergen Reactions  . Amoxicillin-Pot Clavulanate Hives and Other (See Comments)    No reaction noted  . Doxycycline Hives and Other (See Comments)    HIVES  . Peanut Oil Other (See Comments)    No reaction noted Unsure, told she was allergic per allergist  . Peanut-Containing Drug Products     Physical Exam: BP 130/78 (BP Location: Right Arm, Patient Position: Sitting, Cuff Size: Normal)   Pulse 72   Temp 98.5 F (36.9 C) (Oral)   Resp (!) 24    Physical Exam  Constitutional: She is oriented to person, place, and time. She appears well-developed and well-nourished.  HENT:  Eyes normal. Ears normal. Nose mild swelling of nasal turbinates. Pharynx normal.  Neck: Neck supple.  Cardiovascular:  S1 and S2 normal no murmurs  Pulmonary/Chest:  Clear to percussion and auscultation  Lymphadenopathy:    She has no cervical adenopathy.  Neurological: She is alert and oriented to person, place, and time.  Psychiatric: She has a normal mood and affect.  Her behavior is normal. Judgment and thought content normal.  Vitals reviewed.   Diagnostics:  FVC 2.96 L FEV1 2.64 L. Predicted FVC 3.20 L predicted FEV1 2.62 L-the spirometry is in the normal range  Assessment and Plan: 1. Moderate persistent asthma without complication   2. Other allergic rhinitis   3. Gastroesophageal reflux disease without esophagitis   4. Anaphylactic shock due to food, subsequent encounter     Meds ordered this encounter  Medications  . lansoprazole (PREVACID) 30 MG capsule    Sig: Take 1 capsule (30 mg total) by mouth daily.    Dispense:  90 capsule    Refill:  3    For reflux. Please dispense 90 day supply/  . montelukast (SINGULAIR) 10 MG tablet    Sig: Take 1 tablet (10 mg total) by mouth at bedtime.    Dispense:  90 tablet    Refill:  3    For for cough or wheeze. Please dispense 90 day supply.  Marland Kitchen levocetirizine (XYZAL) 5 MG tablet    Sig: Take 1 tablet (5 mg total) by mouth daily.    Dispense:  90 tablet    Refill:  3    For runny nose or itching. Please dispense 90 day supply    Patient Instructions  Continue on the current treatment plan Call me if you're not doing well on this treatment plan   Return in about 6  months (around 06/07/2017).    Thank you for the opportunity to care for this patient.  Please do not hesitate to contact me with questions.  Penne Lash, M.D.  [facility] [address] [place], [place] [address] [phone]

## 2016-12-15 ENCOUNTER — Ambulatory Visit: Payer: BC Managed Care – PPO | Admitting: Pediatrics

## 2016-12-28 DIAGNOSIS — D75839 Thrombocytosis, unspecified: Secondary | ICD-10-CM

## 2016-12-28 HISTORY — DX: Thrombocytosis, unspecified: D75.839

## 2017-02-08 ENCOUNTER — Telehealth: Payer: Self-pay | Admitting: Nurse Practitioner

## 2017-02-08 NOTE — Telephone Encounter (Signed)
Patient says she have clots during her cycle. Last seen March 2016. Ok to leave a detailed message.

## 2017-02-08 NOTE — Telephone Encounter (Signed)
Spoke with patient. Patient states that she has been having increased cramping, passing clots, and heavy bleeding with her cycles. Patients cycle started on 02/06/2017. Is passing quarter sized clots. Is changing tampon every 2 hours. Feels fatigued, but feels this is related to lack of sleep with studying for exams. Not on any form of birth control. Advised she will need to be seen for further evaluation. Patient declines appointment before March 5th due to school schedule. Appointment scheduled for 03/01/2017 at 11:15 am with Kem Boroughs, FNP. Patient is agreeable to date and time. Aware if bleeding increases to having to change her tampon every hour for more than two hours due to bleeding through, becomes light headed, or weak will need to be seen earlier with our office for evaluation. Patient is agreeable.  Routing to provider for final review. Patient agreeable to disposition. Will close encounter.

## 2017-03-01 ENCOUNTER — Ambulatory Visit: Payer: BC Managed Care – PPO | Admitting: Nurse Practitioner

## 2017-03-01 ENCOUNTER — Encounter: Payer: Self-pay | Admitting: Nurse Practitioner

## 2017-03-01 NOTE — Progress Notes (Signed)
Patient ID: Stephanie Simpson, female   DOB: 08/17/1996, 21 y.o.   MRN: [id number]  GYNECOLOGY  VISIT   HPI: 21 y.o.   Single  African American  Female G0P000 with LMP 02/05/17.  Here for a consult about menses.  Now her cycles are heavier for 5-6 days and lasting for 7 days.  Regular tampon an overnight pad changing every 1-2 hours.  Has cramps and very little help with Motrin 800 mg.  Some PMS symptoms.  Not SA ever and not dating.  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   GYNECOLOGIC HISTORY: Patient's last menstrual period was 02/05/2017 (exact date). Contraception:  never SA         OB History    Gravida Para Term Preterm AB Living   0 0 0 0 0 0   SAB TAB Ectopic Multiple Live Births   0 0 0 0 0         Patient Active Problem List   Diagnosis Date Noted  . Moderate persistent asthma without complication [id number]  . Gastroesophageal reflux disease without esophagitis 05/11/2016  . Allergic rhinitis 12/18/2015  . Mild persistent asthma 12/18/2015  . GERD (gastroesophageal reflux disease) 12/18/2015  . 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 ADD (attention deficit disorder) 2013  . Asthma   . Dysmenorrhea   . Migraines     Past Surgical History:  Procedure Laterality Date  . OTHER SURGICAL HISTORY     hematoma surgery on head as an infant  . TONSILLECTOMY AND ADENOIDECTOMY    . WISDOM TOOTH EXTRACTION      Current Outpatient Prescriptions  Medication Sig Dispense Refill  . beclomethasone (QVAR) 80 MCG/ACT inhaler Inhale 2 puffs into the lungs 2 (two) times daily. 1 Inhaler 5  . clindamycin-benzoyl peroxide (BENZACLIN) gel Apply to face daily to twice daily    . DYMISTA 137-50 MCG/ACT SUSP INHALE 1 TO 2 SPRAYS IN EACH NOSTRIL TWICE DAILY FOR STUFFY NOSE OR CONGESTION 1 Bottle 0  . EPINEPHrine (EPIPEN 2-PAK) 0.3 mg/0.3 mL IJ SOAJ injection INJECT  INTRAMUSCULARLY AS DIRECTED FOR SEVERE LIFE-THREATENING ALLERGIC REACTION.    . frovatriptan (FROVA) 2.5 MG tablet Take one tablet (2.5 mg total) by mouth as needed for Migraine. If recurs, may repeat after 2 hours. Max of 3 tabs in 24 hours.    Marland Kitchen ibuprofen (ADVIL,MOTRIN) 800 MG tablet Take 1 tablet (800 mg total) by mouth every 8 (eight) hours as needed. 30 tablet 0  . lansoprazole (PREVACID) 30 MG capsule Take 1 capsule (30 mg total) by mouth daily. 90 capsule 3  . levalbuterol (XOPENEX HFA) 45 MCG/ACT inhaler Inhale into the lungs.    Marland Kitchen levalbuterol (XOPENEX) 1.25 MG/3ML nebulizer solution Take 1 ampule by nebulization every 4 (four) hours as needed.    Marland Kitchen levocetirizine (XYZAL) 5 MG tablet Take 1 tablet (5 mg total) by mouth daily. 90 tablet 3  . methylphenidate 27 MG PO CR tablet Take by mouth.    . montelukast (SINGULAIR) 10 MG tablet Take 1 tablet (10 mg total) by mouth at bedtime. 90 tablet 3  . zonisamide (ZONEGRAN) 100 MG capsule Take by mouth.     No current facility-administered medications for this visit.      ALLERGIES: Amoxicillin-pot clavulanate; Doxycycline; Peanut oil; and Peanut-containing drug products  Family History  Problem Relation Age of Onset  .  Diabetes Mother   . Colon cancer Maternal Grandmother   . Diabetes Maternal Grandmother   . Lung cancer Maternal Grandfather   . Stroke Paternal Grandmother   . Breast cancer Maternal Aunt   . Pancreatic cancer Paternal Uncle     Social History   Social History  . Marital status: Single    Spouse name: N/A  . Number of children: N/A  . Years of education: N/A   Occupational History  . Not on file.   Social History Main Topics  . Smoking status: Never Smoker  . Smokeless tobacco: Never Used  . Alcohol use No  . Drug use: No  . Sexual activity: No     Comment: never sexually active   Other Topics Concern  . Not on file   Social History Narrative  . No narrative on file    ROS  PHYSICAL EXAMINATION:    BP 110/70 (BP Location: Right Arm, Patient Position: Sitting, Cuff Size: Normal)   Pulse 72   Ht 5' 0.75" (1.543 m)   Wt 116 lb (52.6 kg)   LMP 02/05/2017 (Exact Date)   BMI 22.10 kg/m      General appearance: alert, cooperative and appears stated age Heart/ lungs: clear   ASSESSMENT  Menorrhagia with regular cycle  History of dysmenorrhea  Never SA  History of migraine HA's without Aura  History of ADD    PLAN:  Discussed various treatment options including OCP; POP, Nuva Ring, Nexplanon, and IUD.   She decided on OCP and will start her on Loestrin Fe 1/20 for  3 months.  Counseled with potential SE and risk.  She is given start date and BUM if needed.  She plans to return in 3 months when out of school.  She is also given information about Verdia Kuba IUD and would like to consider.  She will think about that option and discuss at her return.  She is given a refill on Motrin 800 mg to take prn.  Will keep a menses diary.   An After Visit Summary was printed and given to the patient.  18 minutes face to face time of which over 75% was spent in counseling with pt and her mother.

## 2017-03-02 ENCOUNTER — Encounter: Payer: Self-pay | Admitting: Nurse Practitioner

## 2017-03-02 ENCOUNTER — Ambulatory Visit (INDEPENDENT_AMBULATORY_CARE_PROVIDER_SITE_OTHER): Payer: BC Managed Care – PPO | Admitting: Nurse Practitioner

## 2017-03-02 VITALS — BP 110/70 | HR 72 | Ht 60.75 in | Wt 116.0 lb

## 2017-03-02 DIAGNOSIS — N920 Excessive and frequent menstruation with regular cycle: Secondary | ICD-10-CM

## 2017-03-02 DIAGNOSIS — N946 Dysmenorrhea, unspecified: Secondary | ICD-10-CM

## 2017-03-02 LAB — HEMOGLOBIN, FINGERSTICK: HEMOGLOBIN, FINGERSTICK: 11.9 g/dL — AB (ref 12.0–15.0)

## 2017-03-02 MED ORDER — IBUPROFEN 800 MG PO TABS: 800.0000 mg | ORAL_TABLET | Freq: Three times a day (TID) | ORAL | 2 refills | Status: DC | PRN

## 2017-03-02 MED ORDER — NORETHIN ACE-ETH ESTRAD-FE 1-20 MG-MCG PO TABS: 1.0000 | ORAL_TABLET | Freq: Every day | ORAL | 0 refills | Status: DC

## 2017-03-02 NOTE — Patient Instructions (Signed)
Oral Contraception Information Oral contraceptive pills (OCPs) are medicines taken to prevent pregnancy. OCPs work by preventing the ovaries from releasing eggs. The hormones in OCPs also cause the cervical mucus to thicken, preventing the sperm from entering the uterus. The hormones also cause the uterine lining to become thin, not allowing a fertilized egg to attach to the inside of the uterus. OCPs are highly effective when taken exactly as prescribed. However, OCPs do not prevent sexually transmitted diseases (STDs). Safe sex practices, such as using condoms along with the pill, can help prevent STDs. Before taking the pill, you may have a physical exam and Pap test. Your health care provider may order blood tests. The health care provider will make sure you are a good candidate for oral contraception. Discuss with your health care provider the possible side effects of the OCP you may be prescribed. When starting an OCP, it can take 2 to 3 months for the body to adjust to the changes in hormone levels in your body. Types of oral contraception  The combination pill-This pill contains estrogen and progestin (synthetic progesterone) hormones. The combination pill comes in 21-day, 28-day, or 91-day packs. Some types of combination pills are meant to be taken continuously (365-day pills). With 21-day packs, you do not take pills for 7 days after the last pill. With 28-day packs, the pill is taken every day. The last 7 pills are without hormones. Certain types of pills have more than 21 hormone-containing pills. With 91-day packs, the first 84 pills contain both hormones, and the last 7 pills contain no hormones or contain estrogen only.  The minipill-This pill contains the progesterone hormone only. The pill is taken every day continuously. It is very important to take the pill at the same time each day. The minipill comes in packs of 28 pills. All 28 pills contain the hormone. Advantages of oral  contraceptive pills  Decreases premenstrual symptoms.  Treats menstrual period cramps.  Regulates the menstrual cycle.  Decreases a heavy menstrual flow.  May treatacne, depending on the type of pill.  Treats abnormal uterine bleeding.  Treats polycystic ovarian syndrome.  Treats endometriosis.  Can be used as emergency contraception. Things that can make oral contraceptive pills less effective OCPs can be less effective if:  You forget to take the pill at the same time every day.  You have a stomach or intestinal disease that lessens the absorption of the pill.  You take OCPs with other medicines that make OCPs less effective, such as antibiotics, certain HIV medicines, and some seizure medicines.  You take expired OCPs.  You forget to restart the pill on day 7, when using the packs of 21 pills. Risks associated with oral contraceptive pills Oral contraceptive pills can sometimes cause side effects, such as:  Headache.  Nausea.  Breast tenderness.  Irregular bleeding or spotting. Combination pills are also associated with a small increased risk of:  Blood clots.  Heart attack.  Stroke. This information is not intended to replace advice given to you by your health care provider. Make sure you discuss any questions you have with your health care provider. Document Released: 03/06/2003 Document Revised: 05/21/2016 Document Reviewed: 06/04/2013 Elsevier Interactive Patient Education  2017 Elsevier Inc.  

## 2017-03-07 NOTE — Progress Notes (Signed)
Encounter reviewed by Dr. Kara Mierzejewski Amundson C. Silva.  

## 2017-03-08 ENCOUNTER — Encounter: Payer: Self-pay | Admitting: Nurse Practitioner

## 2017-04-14 ENCOUNTER — Telehealth: Payer: Self-pay | Admitting: Nurse Practitioner

## 2017-04-14 NOTE — Telephone Encounter (Signed)
Patient calling to schedule an appointment for [facility] insertion.

## 2017-04-14 NOTE — Telephone Encounter (Signed)
My concern for her is with the [facility] she may need a cervical block.  She has never been SA.  She may be more anxious and it may do well for her to have a consult visit before procedure is done with any of the surgeons. She may need pre medication.

## 2017-04-14 NOTE — Telephone Encounter (Signed)
Left message to call Kaitlyn at [phone]. 

## 2017-04-14 NOTE — Telephone Encounter (Signed)
Spoke with patient. Patient states that she would like to schedule her Stephanie Simpson insertion for when she is on summer break in 3 months. Advised patient she will need to contact the office with the first day of her menses during the time she would like to have Kyleena inserted so that she can be scheduled. Patient verbalizes understanding.  Routing to provider for final review. Patient agreeable to disposition. Will close encounter.

## 2017-04-15 NOTE — Telephone Encounter (Signed)
Spoke with patient. Advised of message as seen below from Stephanie Simpson, [name]. Patient verbalizes understanding and would like to schedule consult at this time. Appointment scheduled for 06/07/2017 at 3 pm with Dr.Jertson. Patient is agreeable to date and time.  Cc: Dr.Jertson  Routing to provider for final review. Encounter previously closed.

## 2017-05-07 ENCOUNTER — Other Ambulatory Visit: Payer: Self-pay

## 2017-05-07 MED ORDER — MONTELUKAST SODIUM 10 MG PO TABS: 10.0000 mg | ORAL_TABLET | Freq: Every day | ORAL | 3 refills | Status: DC

## 2017-05-21 ENCOUNTER — Telehealth: Payer: Self-pay | Admitting: Nurse Practitioner

## 2017-05-21 NOTE — Telephone Encounter (Signed)
Patient's mom has some questions for the nurse regarding the IUD consult. Mom is on the [facility].

## 2017-05-21 NOTE — Telephone Encounter (Signed)
Spoke with patient's mother Mariann Laster, okay per ROI. Mother states that based on when the patient is due to start her menses on OCP she will need to move IUD consultation forward. Patient is due to start her menses on 06/02/2017. Consult moved to 05/31/2017 at 9:30 am with Dr.Silva. This is to discuss possible need for pre medication per Kem Boroughs, FNP. Mother and patient are agreeable to date and time of appointment. Aware she will need to continue OCP until IUD is placed.  Cc: Kem Boroughs, FNP   Routing to provider for final review. Patient agreeable to disposition. Will close encounter.

## 2017-05-31 ENCOUNTER — Encounter: Payer: Self-pay | Admitting: Obstetrics and Gynecology

## 2017-05-31 ENCOUNTER — Ambulatory Visit (INDEPENDENT_AMBULATORY_CARE_PROVIDER_SITE_OTHER): Payer: BC Managed Care – PPO | Admitting: Obstetrics and Gynecology

## 2017-05-31 ENCOUNTER — Other Ambulatory Visit (HOSPITAL_COMMUNITY)
Admission: RE | Admit: 2017-05-31 | Discharge: 2017-05-31 | Disposition: A | Discharge status: Home / Self Care | Payer: BC Managed Care – PPO | Source: Ambulatory Visit | Attending: Obstetrics and Gynecology | Admitting: Obstetrics and Gynecology

## 2017-05-31 VITALS — BP 112/80 | HR 80 | Resp 14 | Ht 60.75 in | Wt 119.0 lb

## 2017-05-31 DIAGNOSIS — Z124 Encounter for screening for malignant neoplasm of cervix: Secondary | ICD-10-CM | POA: Diagnosis not present

## 2017-05-31 DIAGNOSIS — Z01419 Encounter for gynecological examination (general) (routine) without abnormal findings: Secondary | ICD-10-CM | POA: Diagnosis present

## 2017-05-31 DIAGNOSIS — N920 Excessive and frequent menstruation with regular cycle: Secondary | ICD-10-CM

## 2017-05-31 NOTE — Progress Notes (Signed)
GYNECOLOGY  VISIT   HPI: 21 y.o.   Single  African American  female   [place] with Patient's last menstrual period was 05/04/2017.   here for IUD consult.    Mother present for the entire visit.   Has dysmenorrhea and heavy monthly cycles with pad change every 1 - 2 hours.   Currently on combined oral contraceptives since March 2018.  Menses are regular, less bleeding, and less cramping.  Now is changing her pads about every 1 - 2 hours but it is not as heavy.  No missed or skipped pills.   Never sexually active.  Has migraines without aura.  Not sure if she is having an increase in her headaches or not.  Had a visual aura this last month.   Hgb 11.9 03/02/17.  GYNECOLOGIC HISTORY: Patient's last menstrual period was 05/04/2017. Contraception:  OCP Menopausal hormone therapy:  None  Last mammogram:  None  Last pap smear:   None        OB History    Gravida Para Term Preterm AB Living   0 0 0 0 0 0   SAB TAB Ectopic Multiple Live Births   0 0 0 0 0         Patient Active Problem List   Diagnosis Date Noted  . Moderate persistent asthma without complication 09/60/4540  . Gastroesophageal reflux disease without esophagitis 05/11/2016  . Allergic rhinitis 12/18/2015  . Mild persistent asthma 12/18/2015  . GERD (gastroesophageal reflux disease) 12/18/2015  . 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 ADD (attention deficit disorder) 2013  . Asthma   . Dysmenorrhea   . Migraines    with aura    Past Surgical History:  Procedure Laterality Date  . OTHER SURGICAL HISTORY     hematoma surgery on head as an infant  . TONSILLECTOMY AND ADENOIDECTOMY    . WISDOM TOOTH EXTRACTION      Current Outpatient Prescriptions  Medication Sig Dispense Refill  . beclomethasone (QVAR) 80 MCG/ACT inhaler Inhale 2 puffs into the lungs 2 (two) times daily. 1 Inhaler 5  . clindamycin-benzoyl peroxide (BENZACLIN) gel  Apply to face daily to twice daily    . DYMISTA 137-50 MCG/ACT SUSP INHALE 1 TO 2 SPRAYS IN EACH NOSTRIL TWICE DAILY FOR STUFFY NOSE OR CONGESTION 1 Bottle 0  . frovatriptan (FROVA) 2.5 MG tablet Take one tablet (2.5 mg total) by mouth as needed for Migraine. If recurs, may repeat after 2 hours. Max of 3 tabs in 24 hours.    Marland Kitchen ibuprofen (ADVIL,MOTRIN) 800 MG tablet Take 1 tablet (800 mg total) by mouth every 8 (eight) hours as needed. 30 tablet 2  . lansoprazole (PREVACID) 30 MG capsule Take 1 capsule (30 mg total) by mouth daily. 90 capsule 3  . levalbuterol (XOPENEX HFA) 45 MCG/ACT inhaler Inhale into the lungs.    Marland Kitchen levalbuterol (XOPENEX) 1.25 MG/3ML nebulizer solution Take 1 ampule by nebulization every 4 (four) hours as needed.    Marland Kitchen levocetirizine (XYZAL) 5 MG tablet Take 1 tablet (5 mg total) by mouth daily. 90 tablet 3  . methylphenidate 27 MG PO CR tablet Take by mouth.    . montelukast (SINGULAIR) 10 MG tablet Take 1 tablet (10 mg total) by mouth at bedtime. 90 tablet 3  . norethindrone-ethinyl estradiol (JUNEL FE,GILDESS FE,LOESTRIN FE) 1-20 MG-MCG tablet Take 1 tablet by mouth daily. 3 Package 0  . zonisamide (ZONEGRAN)  100 MG capsule Take by mouth.    . EPINEPHrine (EPIPEN 2-PAK) 0.3 mg/0.3 mL IJ SOAJ injection INJECT INTRAMUSCULARLY AS DIRECTED FOR SEVERE LIFE-THREATENING ALLERGIC REACTION.     No current facility-administered medications for this visit.      ALLERGIES: Amoxicillin-pot clavulanate; Doxycycline; Peanut oil; and Peanut-containing drug products  Family History  Problem Relation Age of Onset  . Diabetes Mother   . Colon cancer Maternal Grandmother   . Diabetes Maternal Grandmother   . Lung cancer Maternal Grandfather   . Stroke Paternal Grandmother   . Breast cancer Maternal Aunt   . Pancreatic cancer Paternal Uncle     Social History   Social History  . Marital status: Single    Spouse name: N/A  . Number of children: N/A  . Years of education: N/A    Occupational History  . Not on file.   Social History Main Topics  . Smoking status: Never Smoker  . Smokeless tobacco: Never Used  . Alcohol use No  . Drug use: No  . Sexual activity: No     Comment: never sexually active   Other Topics Concern  . Not on file   Social History Narrative  . No narrative on file    ROS:  Pertinent items are noted in HPI.  PHYSICAL EXAMINATION:    BP 112/80 (BP Location: Left Arm, Patient Position: Sitting, Cuff Size: Normal)   Pulse 80   Resp 14   Ht 5' 0.75" (1.543 m)   Wt 119 lb (54 kg)   LMP 05/04/2017   BMI 22.67 kg/m     General appearance: alert, cooperative and appears stated age Head: Normocephalic, without obvious abnormality, atraumatic Neck: no adenopathy, supple, symmetrical, trachea midline and thyroid normal to inspection and palpation Lungs: clear to auscultation bilaterally Heart: regular rate and rhythm Abdomen: soft, non-tender, no masses,  no organomegaly Extremities: extremities normal, atraumatic, no cyanosis or edema No abnormal inguinal nodes palpated Neurologic: Grossly normal  Pelvic: External genitalia:  no lesions              Urethra:  normal appearing urethra with no masses, tenderness or lesions              Bartholins and Skenes: normal                 Vagina: normal appearing vagina with normal color and discharge, no lesions              Cervix: no lesions.  Pap taken.                 Bimanual Exam:  Uterus:  normal size, contour, position, consistency, mobility, non-tender              Adnexa: no mass, fullness, tenderness              Chaperone was present for exam.  ASSESSMENT  Migraines with aura.  Dysmenorrhea and menorrhagia.  Cervical cancer screening.   PLAN  Discussed progesterone only options for contraception and tx of menorrhagia.  Nexplanon, Depo Provera, Micronor, Kyleena IUD.  Kyleena IUD discussed in detail. Risks/benefits.  Patient wishes to proceed.  Will plan for  Cytotec and paracervical block.  This explained.   Not sent to pharmacy at this time.  Check TSH and CBC today.  Pap taken.  Stop combined OCPs.  Rationale explained. An After Visit Summary was printed and given to the patient.  _25_____ minutes face to face time of which  over 50% was spent in counseling.

## 2017-05-31 NOTE — Patient Instructions (Signed)

## 2017-06-01 ENCOUNTER — Telehealth: Payer: Self-pay | Admitting: Obstetrics and Gynecology

## 2017-06-01 ENCOUNTER — Encounter: Payer: Self-pay | Admitting: Obstetrics and Gynecology

## 2017-06-01 LAB — CBC
HEMATOCRIT: 37.8 % (ref 34.0–46.6)
Hemoglobin: 12.2 g/dL (ref 11.1–15.9)
MCH: 27.5 pg (ref 26.6–33.0)
MCHC: 32.3 g/dL (ref 31.5–35.7)
MCV: 85 fL (ref 79–97)
Platelets: 394 10*3/uL — ABNORMAL HIGH (ref 150–379)
RBC: 4.44 x10E6/uL (ref 3.77–5.28)
RDW: 15.3 % (ref 12.3–15.4)
WBC: 3.6 10*3/uL (ref 3.4–10.8)

## 2017-06-01 LAB — CYTOLOGY - PAP: Diagnosis: NEGATIVE

## 2017-06-01 LAB — TSH: TSH: 2.17 u[IU]/mL (ref 0.450–4.500)

## 2017-06-01 MED ORDER — MISOPROSTOL 200 MCG PO TABS: ORAL_TABLET | ORAL | 0 refills | Status: DC

## 2017-06-01 NOTE — Telephone Encounter (Signed)
Spoke with patient. Patient started her menses today 06/01/2017 and would like to have [place] IUD inserted. Appointment scheduled for 06/04/2017 at 10:30 am with Dr.Silva. Patient is agreeable to date and time. Pre procedure instructions given.  Motrin instructions given. Motrin=Advil=Ibuprofen, 800 mg one hour before appointment. Eat a meal and hydrate well before appointment. Cytotec instructions given. Rx for Take Cytotec 200 mcg place 1 tablet PV night before the procedure. Place 1 tablet PV the morning of the procedure #2 0RF sent to pharmacy on file.  Routing to provider for final review. Patient agreeable to disposition. Will close encounter.

## 2017-06-01 NOTE — Telephone Encounter (Signed)
Patient's cycle came on today and she's calling to schedule an appointment for iud insertion.

## 2017-06-02 ENCOUNTER — Encounter: Payer: Self-pay | Admitting: Obstetrics and Gynecology

## 2017-06-02 ENCOUNTER — Ambulatory Visit: Payer: BC Managed Care – PPO | Admitting: Nurse Practitioner

## 2017-06-04 ENCOUNTER — Ambulatory Visit (INDEPENDENT_AMBULATORY_CARE_PROVIDER_SITE_OTHER): Payer: BC Managed Care – PPO | Admitting: Obstetrics and Gynecology

## 2017-06-04 ENCOUNTER — Encounter: Payer: Self-pay | Admitting: Obstetrics and Gynecology

## 2017-06-04 VITALS — BP 116/60 | HR 84 | Wt 119.0 lb

## 2017-06-04 DIAGNOSIS — Z30430 Encounter for insertion of intrauterine contraceptive device: Secondary | ICD-10-CM | POA: Diagnosis not present

## 2017-06-04 DIAGNOSIS — N920 Excessive and frequent menstruation with regular cycle: Secondary | ICD-10-CM

## 2017-06-04 NOTE — Patient Instructions (Signed)

## 2017-06-04 NOTE — Progress Notes (Signed)
GYNECOLOGY  VISIT   HPI: 21 y.o.   Single  African American  female   [place] with Patient's last menstrual period was 06/01/2017.   here for [facility] insertion Took ibuprofen 800 mg prior to appt.  Used Cytotec last hs and this am.     Has dysmenorrhea and menorrhagia.  UPT: Negative  GYNECOLOGIC HISTORY: Patient's last menstrual period was 06/01/2017. Contraception:  abstinence Menopausal hormone therapy:  n/a Last mammogram:  n/a Last pap smear:   n/a        OB History    Gravida Para Term Preterm AB Living   0 0 0 0 0 0   SAB TAB Ectopic Multiple Live Births   0 0 0 0 0         Patient Active Problem List   Diagnosis Date Noted  . Moderate persistent asthma without complication 09/73/5329  . Gastroesophageal reflux disease without esophagitis 05/11/2016  . Allergic rhinitis 12/18/2015  . Mild persistent asthma 12/18/2015  . GERD (gastroesophageal reflux disease) 12/18/2015  . 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 ADD (attention deficit disorder) 2013  . Asthma   . Dysmenorrhea   . Migraines    with aura  . Thrombocytosis ([place]) 2018   394,000 on 05/31/17    Past Surgical History:  Procedure Laterality Date  . OTHER SURGICAL HISTORY     hematoma surgery on head as an infant  . TONSILLECTOMY AND ADENOIDECTOMY    . WISDOM TOOTH EXTRACTION      Current Outpatient Prescriptions  Medication Sig Dispense Refill  . beclomethasone (QVAR) 80 MCG/ACT inhaler Inhale 2 puffs into the lungs 2 (two) times daily. 1 Inhaler 5  . clindamycin-benzoyl peroxide (BENZACLIN) gel Apply to face daily to twice daily    . DYMISTA 137-50 MCG/ACT SUSP INHALE 1 TO 2 SPRAYS IN EACH NOSTRIL TWICE DAILY FOR STUFFY NOSE OR CONGESTION 1 Bottle 0  . EPINEPHrine (EPIPEN 2-PAK) 0.3 mg/0.3 mL IJ SOAJ injection INJECT INTRAMUSCULARLY AS DIRECTED FOR SEVERE LIFE-THREATENING ALLERGIC REACTION.    . frovatriptan (FROVA) 2.5 MG  tablet Take one tablet (2.5 mg total) by mouth as needed for Migraine. If recurs, may repeat after 2 hours. Max of 3 tabs in 24 hours.    Marland Kitchen ibuprofen (ADVIL,MOTRIN) 800 MG tablet Take 1 tablet (800 mg total) by mouth every 8 (eight) hours as needed. 30 tablet 2  . lansoprazole (PREVACID) 30 MG capsule Take 1 capsule (30 mg total) by mouth daily. 90 capsule 3  . levalbuterol (XOPENEX HFA) 45 MCG/ACT inhaler Inhale into the lungs.    Marland Kitchen levalbuterol (XOPENEX) 1.25 MG/3ML nebulizer solution Take 1 ampule by nebulization every 4 (four) hours as needed.    Marland Kitchen levocetirizine (XYZAL) 5 MG tablet Take 1 tablet (5 mg total) by mouth daily. 90 tablet 3  . methylphenidate 27 MG PO CR tablet Take by mouth.    . misoprostol (CYTOTEC) 200 MCG tablet Place 1 tablet PV night before the procedure. Place 1 tablet PV the morning of the procedure. 2 tablet 0  . montelukast (SINGULAIR) 10 MG tablet Take 1 tablet (10 mg total) by mouth at bedtime. 90 tablet 3  . zonisamide (ZONEGRAN) 100 MG capsule Take by mouth.     No current facility-administered medications for this visit.      ALLERGIES: Amoxicillin-pot clavulanate; Doxycycline; Peanut oil; and Peanut-containing drug products  Family History  Problem Relation Age of Onset  .  Diabetes Mother   . Colon cancer Maternal Grandmother   . Diabetes Maternal Grandmother   . Lung cancer Maternal Grandfather   . Stroke Paternal Grandmother   . Breast cancer Maternal Aunt   . Pancreatic cancer Paternal Uncle     Social History   Social History  . Marital status: Single    Spouse name: N/A  . Number of children: N/A  . Years of education: N/A   Occupational History  . Not on file.   Social History Main Topics  . Smoking status: Never Smoker  . Smokeless tobacco: Never Used  . Alcohol use No  . Drug use: No  . Sexual activity: No     Comment: never sexually active   Other Topics Concern  . Not on file   Social History Narrative  . No narrative on  file    ROS:  Pertinent items are noted in HPI.  PHYSICAL EXAMINATION:    BP 116/60 (BP Location: Right Arm, Patient Position: Sitting, Cuff Size: Normal)   Pulse 84   Wt 119 lb (54 kg)   LMP 06/01/2017   BMI 22.67 kg/m     General appearance: alert, cooperative and appears stated age   Pelvic: External genitalia:  no lesions              Urethra:  normal appearing urethra with no masses, tenderness or lesions              Bartholins and Skenes: normal                 Vagina: normal appearing vagina with normal color and discharge, no lesions              Cervix: no lesions                Bimanual Exam:  Uterus:  normal size, contour, position, consistency, mobility, non-tender              Adnexa: no mass, fullness, tenderness    Kyleena IUD insertion - lot [id number], exp sept 2019.  Consent performed.  Sterile prep with Hibiclens.  Paracervical block with 10 cc 1% lidocaine - lot [id number], exp 01/22.  tanaculum to ant. Cervical lip Uterus sounded to a little over 6 cm.              Kyleena IUD placed without difficulty. Strings trimmed and shown to patient.  Kyleena information card and brochure to patient.  No complications.  Minimal EBL.  Bimanual exam repeated, no change.  Chaperone was present for exam.  ASSESSMENT  Kyleena IUD insertion.   PLAN  Post IUD insertion instructions and precautions given.  Ibuprofen 800 mg po q 8 hours prn.  Follow up in 5 weeks, sooner as needed.   An After Visit Summary was printed and given to the patient.

## 2017-06-07 ENCOUNTER — Ambulatory Visit: Payer: BC Managed Care – PPO | Admitting: Obstetrics and Gynecology

## 2017-07-01 DIAGNOSIS — L920 Granuloma annulare: Secondary | ICD-10-CM | POA: Insufficient documentation

## 2017-07-09 ENCOUNTER — Encounter: Payer: Self-pay | Admitting: Obstetrics and Gynecology

## 2017-07-09 ENCOUNTER — Ambulatory Visit (INDEPENDENT_AMBULATORY_CARE_PROVIDER_SITE_OTHER): Payer: BC Managed Care – PPO | Admitting: Obstetrics and Gynecology

## 2017-07-09 VITALS — BP 116/80 | HR 88 | Resp 16 | Wt 118.0 lb

## 2017-07-09 DIAGNOSIS — Z30431 Encounter for routine checking of intrauterine contraceptive device: Secondary | ICD-10-CM | POA: Diagnosis not present

## 2017-07-09 NOTE — Progress Notes (Signed)
GYNECOLOGY  VISIT   HPI: 21 y.o.   Single  African American  female   [place] with Patient's last menstrual period was 06/30/2017.   here for 5 week recheck.  Kyleena inserted 06/04/17.  Menses are lighter and is still spotting today.  Less cramping.   Not sexually active.   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   GYNECOLOGIC HISTORY: Patient's last menstrual period was 06/30/2017. Contraception: Verdia Kuba IUD -- inserted 06/04/17 Menopausal hormone therapy:  n/a Last mammogram:  N/a Last pap smear:   05/31/17 Pap smear normal        OB History    Gravida Para Term Preterm AB Living   0 0 0 0 0 0   SAB TAB Ectopic Multiple Live Births   0 0 0 0 0         Patient Active Problem List   Diagnosis Date Noted  . Moderate persistent asthma without complication 50/08/3817  . Gastroesophageal reflux disease without esophagitis 05/11/2016  . Allergic rhinitis 12/18/2015  . Mild persistent asthma 12/18/2015  . GERD (gastroesophageal reflux disease) 12/18/2015  . 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 ADD (attention deficit disorder) 2013  . Asthma   . Dysmenorrhea   . Migraines    with aura  . Thrombocytosis ([place]) 2018   394,000 on 05/31/17    Past Surgical History:  Procedure Laterality Date  . OTHER SURGICAL HISTORY     hematoma surgery on head as an infant  . TONSILLECTOMY AND ADENOIDECTOMY    . WISDOM TOOTH EXTRACTION      Current Outpatient Prescriptions  Medication Sig Dispense Refill  . beclomethasone (QVAR) 80 MCG/ACT inhaler Inhale 2 puffs into the lungs 2 (two) times daily. 1 Inhaler 5  . DYMISTA 137-50 MCG/ACT SUSP INHALE 1 TO 2 SPRAYS IN EACH NOSTRIL TWICE DAILY FOR STUFFY NOSE OR CONGESTION 1 Bottle 0  . EPINEPHrine (EPIPEN 2-PAK) 0.3 mg/0.3 mL IJ SOAJ injection INJECT INTRAMUSCULARLY AS DIRECTED FOR SEVERE LIFE-THREATENING ALLERGIC REACTION.    . frovatriptan (FROVA) 2.5 MG tablet Take one tablet  (2.5 mg total) by mouth as needed for Migraine. If recurs, may repeat after 2 hours. Max of 3 tabs in 24 hours.    Marland Kitchen ibuprofen (ADVIL,MOTRIN) 800 MG tablet Take 1 tablet (800 mg total) by mouth every 8 (eight) hours as needed. 30 tablet 2  . lansoprazole (PREVACID) 30 MG capsule Take 1 capsule (30 mg total) by mouth daily. 90 capsule 3  . levalbuterol (XOPENEX HFA) 45 MCG/ACT inhaler Inhale into the lungs.    Marland Kitchen levalbuterol (XOPENEX) 1.25 MG/3ML nebulizer solution Take 1 ampule by nebulization every 4 (four) hours as needed.    Marland Kitchen levocetirizine (XYZAL) 5 MG tablet Take 1 tablet (5 mg total) by mouth daily. 90 tablet 3  . methylphenidate 27 MG PO CR tablet Take by mouth.    . montelukast (SINGULAIR) 10 MG tablet Take 1 tablet (10 mg total) by mouth at bedtime. 90 tablet 3  . zonisamide (ZONEGRAN) 100 MG capsule Take by mouth.     No current facility-administered medications for this visit.      ALLERGIES: Amoxicillin-pot clavulanate; Doxycycline; Peanut oil; and Peanut-containing drug products  Family History  Problem Relation Age of Onset  . Diabetes Mother   . Colon cancer Maternal Grandmother   . Diabetes Maternal Grandmother   . Lung cancer Maternal Grandfather   . Stroke Paternal Grandmother   .  Breast cancer Maternal Aunt   . Pancreatic cancer Paternal Uncle     Social History   Social History  . Marital status: Single    Spouse name: N/A  . Number of children: N/A  . Years of education: N/A   Occupational History  . Not on file.   Social History Main Topics  . Smoking status: Never Smoker  . Smokeless tobacco: Never Used  . Alcohol use No  . Drug use: No  . Sexual activity: No     Comment: never sexually active   Other Topics Concern  . Not on file   Social History Narrative  . No narrative on file    ROS:  Pertinent items are noted in HPI.  PHYSICAL EXAMINATION:    BP 116/80 (BP Location: Right Arm, Patient Position: Sitting, Cuff Size: Normal)   Pulse  88   Resp 16   Wt 118 lb (53.5 kg)   LMP 06/30/2017   BMI 22.48 kg/m     General appearance: alert, cooperative and appears stated age  Pelvic: External genitalia:  no lesions              Urethra:  normal appearing urethra with no masses, tenderness or lesions              Bartholins and Skenes: normal                 Vagina: normal appearing vagina with normal color and discharge, no lesions              Cervix: no lesions.  Blood in vagina.  Strings noted.                Bimanual Exam:  Uterus:  normal size, contour, position, consistency, mobility, non-tender              Adnexa: no mass, fullness, tenderness               Chaperone was present for exam.  ASSESSMENT  Kyleena IUD check up.    PLAN  Discussed bleeding profile with Kyleena IUD.  Working for contraception.  Use condoms with intercourse.  I recommend a well woman visit.  She may return for this in January when she is likely to be home from [place].  Follow up prn.   An After Visit Summary was printed and given to the patient.  __15____ minutes face to face time of which over 50% was spent in counseling.

## 2017-07-22 ENCOUNTER — Ambulatory Visit: Payer: BC Managed Care – PPO | Admitting: Podiatry

## 2017-07-26 ENCOUNTER — Telehealth: Payer: Self-pay

## 2017-07-26 ENCOUNTER — Other Ambulatory Visit: Payer: Self-pay

## 2017-07-26 DIAGNOSIS — J3089 Other allergic rhinitis: Secondary | ICD-10-CM

## 2017-07-26 MED ORDER — LEVOCETIRIZINE DIHYDROCHLORIDE 5 MG PO TABS: 5.0000 mg | ORAL_TABLET | Freq: Every day | ORAL | 3 refills | Status: DC

## 2017-07-26 MED ORDER — MONTELUKAST SODIUM 10 MG PO TABS: 10.0000 mg | ORAL_TABLET | Freq: Every day | ORAL | 3 refills | Status: DC

## 2017-07-26 MED ORDER — LANSOPRAZOLE 30 MG PO CPDR: 30.0000 mg | DELAYED_RELEASE_CAPSULE | Freq: Every day | ORAL | 3 refills | Status: DC

## 2017-07-26 MED ORDER — AZELASTINE-FLUTICASONE 137-50 MCG/ACT NA SUSP: NASAL | 5 refills | Status: DC

## 2017-07-26 NOTE — Telephone Encounter (Signed)
Okay to refill her medications without an office visit

## 2017-07-26 NOTE — Telephone Encounter (Signed)
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  Please advise

## 2017-07-26 NOTE — Telephone Encounter (Signed)
Sent in refills for pt and informed her mother

## 2017-07-29 ENCOUNTER — Other Ambulatory Visit: Payer: Self-pay

## 2017-07-29 ENCOUNTER — Telehealth: Payer: Self-pay | Admitting: *Deleted

## 2017-07-29 ENCOUNTER — Telehealth: Payer: Self-pay | Admitting: Obstetrics and Gynecology

## 2017-07-29 MED ORDER — LEVALBUTEROL TARTRATE 45 MCG/ACT IN AERO: 2.0000 | INHALATION_SPRAY | Freq: Four times a day (QID) | RESPIRATORY_TRACT | 1 refills | Status: DC | PRN

## 2017-07-29 NOTE — Telephone Encounter (Signed)
Patient needs prescription refill for xopenex called into [facility] in [place] on [address].

## 2017-07-29 NOTE — Telephone Encounter (Signed)
Left message to call Stephanie Simpson at [phone].  

## 2017-07-29 NOTE — Telephone Encounter (Signed)
Patient is having bad cramps and would like nurse to call regarding a prescription.

## 2017-07-29 NOTE — Telephone Encounter (Signed)
Sent in rx.

## 2017-07-29 NOTE — Telephone Encounter (Signed)
Spoke with patient. Requesting refill for Ibuprofen 800mg  for "really bad menstrual cramps", wakes her from her sleep, pain scale 8/10. States has taken OCP in past to help with cramps, stopped because of migraines with aura. Has kyleena IUD in place, placed 06/04/17.   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 RN asked what she had been taking for pain, patient unsure is going to check, mother put on phone. Mom states she is unsure what she is taking, has tylenol and aleve. Advised mom given pain 8/10 recommended OV for further evaluation. Patient scheduled for OV on 07/30/17 at 9:45am with Dr. Quincy Simmonds. Advised mom would return call with any additional recommendations once reviewed by Dr. Quincy Simmonds.  Routing to provider for final review. Patient is agreeable to disposition. Will close encounter.

## 2017-07-30 ENCOUNTER — Ambulatory Visit (INDEPENDENT_AMBULATORY_CARE_PROVIDER_SITE_OTHER): Payer: BC Managed Care – PPO | Admitting: Obstetrics and Gynecology

## 2017-07-30 ENCOUNTER — Telehealth: Payer: Self-pay | Admitting: Obstetrics and Gynecology

## 2017-07-30 ENCOUNTER — Encounter: Payer: Self-pay | Admitting: Obstetrics and Gynecology

## 2017-07-30 VITALS — BP 118/62 | HR 88 | Temp 98.1°F | Resp 16 | Wt 125.0 lb

## 2017-07-30 DIAGNOSIS — N946 Dysmenorrhea, unspecified: Secondary | ICD-10-CM

## 2017-07-30 MED ORDER — KETOROLAC TROMETHAMINE 10 MG PO TABS: 10.0000 mg | ORAL_TABLET | Freq: Four times a day (QID) | ORAL | 3 refills | Status: DC | PRN

## 2017-07-30 MED ORDER — NORETHINDRONE 0.35 MG PO TABS: 1.0000 | ORAL_TABLET | Freq: Every day | ORAL | 3 refills | Status: DC

## 2017-07-30 NOTE — Telephone Encounter (Signed)
Left detailed message, ok per current dpr. Advised as seen below per Dr. Quincy Simmonds. Advised to return call to office at [phone] with any additional questions.   Patient is agreeable to disposition. Will close encounter.

## 2017-07-30 NOTE — Telephone Encounter (Signed)
Dr. Quincy Simmonds -when did you advise patient to start Micronor? Thanks.

## 2017-07-30 NOTE — Telephone Encounter (Signed)
Patient's mom, Ariauna Farabee (DPR on file to share PHI),  called and requested a call back from the nurse to her daughter directly about when she is to start her birth control pills.   Okay to leave details on her voicemail.

## 2017-07-30 NOTE — Patient Instructions (Signed)
Norethindrone tablets (contraception) What is this medicine? NORETHINDRONE (nor eth IN drone) is an oral contraceptive. The product contains a female hormone known as a progestin. It is used to prevent pregnancy. This medicine may be used for other purposes; ask your health care provider or pharmacist if you have questions. COMMON BRAND NAME(S): Camila, Deblitane 28-Day, Errin, Heather, [place], Jolivette, [place], Nor-QD, Nora-BE, Norlyroc, Ortho Micronor, [employer] 28-Day What should I tell my health care provider before I take this medicine? They need to know if you have any of these conditions: -blood vessel disease or blood clots -breast, cervical, or vaginal cancer -diabetes -heart disease -kidney disease -liver disease -mental depression -migraine -seizures -stroke -vaginal bleeding -an unusual or allergic reaction to norethindrone, other medicines, foods, dyes, or preservatives -pregnant or trying to get pregnant -breast-feeding How should I use this medicine? Take this medicine by mouth with a glass of water. You may take it with or without food. Follow the directions on the prescription label. Take this medicine at the same time each day and in the order directed on the package. Do not take your medicine more often than directed. Contact your pediatrician regarding the use of this medicine in children. Special care may be needed. This medicine has been used in female children who have started having menstrual periods. A patient package insert for the product will be given with each prescription and refill. Read this sheet carefully each time. The sheet may change frequently. Overdosage: If you think you have taken too much of this medicine contact a poison control center or emergency room at once. NOTE: This medicine is only for you. Do not share this medicine with others. What if I miss a dose? Try not to miss a dose. Every time you miss a dose or take a dose late your chance of  pregnancy increases. When 1 pill is missed (even if only 3 hours late), take the missed pill as soon as possible and continue taking a pill each day at the regular time (use a back up method of birth control for the next 48 hours). If more than 1 dose is missed, use an additional birth control method for the rest of your pill pack until menses occurs. Contact your health care professional if more than 1 dose has been missed. What may interact with this medicine? Do not take this medicine with any of the following medications: -amprenavir or fosamprenavir -bosentan This medicine may also interact with the following medications: -antibiotics or medicines for infections, especially rifampin, rifabutin, rifapentine, and griseofulvin, and possibly penicillins or tetracyclines -aprepitant -barbiturate medicines, such as phenobarbital -carbamazepine -felbamate -modafinil -oxcarbazepine -phenytoin -ritonavir or other medicines for HIV infection or AIDS -St. John's wort -topiramate This list may not describe all possible interactions. Give your health care provider a list of all the medicines, herbs, non-prescription drugs, or dietary supplements you use. Also tell them if you smoke, drink alcohol, or use illegal drugs. Some items may interact with your medicine. What should I watch for while using this medicine? Visit your doctor or health care professional for regular checks on your progress. You will need a regular breast and pelvic exam and Pap smear while on this medicine. Use an additional method of birth control during the first cycle that you take these tablets. If you have any reason to think you are pregnant, stop taking this medicine right away and contact your doctor or health care professional. If you are taking this medicine for hormone related problems, it  may take several cycles of use to see improvement in your condition. This medicine does not protect you against HIV infection (AIDS)  or any other sexually transmitted diseases. What side effects may I notice from receiving this medicine? Side effects that you should report to your doctor or health care professional as soon as possible: -breast tenderness or discharge -pain in the abdomen, chest, groin or leg -severe headache -skin rash, itching, or hives -sudden shortness of breath -unusually weak or tired -vision or speech problems -yellowing of skin or eyes Side effects that usually do not require medical attention (report to your doctor or health care professional if they continue or are bothersome): -changes in sexual desire -change in menstrual flow -facial hair growth -fluid retention and swelling -headache -irritability -nausea -weight gain or loss This list may not describe all possible side effects. Call your doctor for medical advice about side effects. You may report side effects to FDA at 1-800-FDA-1088. Where should I keep my medicine? Keep out of the reach of children. Store at room temperature between 15 and 30 degrees C (59 and 86 degrees F). Throw away any unused medicine after the expiration date. NOTE: This sheet is a summary. It may not cover all possible information. If you have questions about this medicine, talk to your doctor, pharmacist, or health care provider.  2018 Elsevier/Gold Standard (2012-09-02 16:41:35)  Ketorolac tablets What is this medicine? KETOROLAC (kee toe ROLE ak) is a non-steroidal anti-inflammatory drug (NSAID). It is used for a short while to treat moderate to severe pain, including pain after surgery. It should not be used for more than 5 days. This medicine may be used for other purposes; ask your health care provider or pharmacist if you have questions. COMMON BRAND NAME(S): Toradol What should I tell my health care provider before I take this medicine? They need to know if you have any of these conditions: -asthma -bleeding problems like hemophilia -cigarette  smoker -drink more than 3 alcohol containing drinks a day -heart disease or circulation problems such as heart failure or leg edema (fluid retention) -high blood pressure -kidney disease -liver disease -stomach bleeding or ulcers -an unusual or allergic reaction to ketorolac, aspirin, other NSAIDs, other medicines, foods, dyes, or preservatives -pregnant or trying to get pregnant -breast-feeding How should I use this medicine? Take this medicine by mouth with a full glass of water. Follow the directions on the prescription label. Take your medicine at regular intervals. Do not take your medicine more often than directed. Do not take more than the recommended dose. A special MedGuide will be given to you by the pharmacist with each prescription and refill. Be sure to read this information carefully each time. Talk to your pediatrician regarding the use of this medicine in children. While this drug may be prescribed for children as young as 68 years of age for selected conditions, precautions do apply. Patients over 27 years old may have a stronger reaction and need a smaller dose. Overdosage: If you think you have taken too much of this medicine contact a poison control center or emergency room at once. NOTE: This medicine is only for you. Do not share this medicine with others. What if I miss a dose? If you miss a dose, take it as soon as you can. If it is almost time for your next dose, take only that dose. Do not take double or extra doses. What may interact with this medicine? Do not take this medicine with any  of the following medications: -aspirin and aspirin-like medicines -cidofovir -methotrexate -NSAIDs, medicines for pain and inflammation, like ibuprofen or naproxen -pemetrexed -probenecid This medicine may also interact with the following  medications: -alcohol -alendronate -alprazolam -carbamazepine -cyclosporine -diuretics -flavocoxid -fluoxetine -ginkgo -lithium -medicines for high blood pressure like enalapril -medicines that affect platelets like pentoxifylline -medicines that treat or prevent blood clots like heparin, warfarin -muscle relaxants -phenytoin -steroid medicines like prednisone or cortisone -thiothixene This list may not describe all possible interactions. Give your health care provider a list of all the medicines, herbs, non-prescription drugs, or dietary supplements you use. Also tell them if you smoke, drink alcohol, or use illegal drugs. Some items may interact with your medicine. What should I watch for while using this medicine? Tell your doctor or health care professional if your pain does not get better. Talk to your doctor before taking another medicine for pain. Do not treat yourself. This medicine does not prevent heart attack or stroke. In fact, this medicine may increase the chance of a heart attack or stroke. The chance may increase with longer use of this medicine and in people who have heart disease. If you take aspirin to prevent heart attack or stroke, talk with your doctor or health care professional. Do not take medicines such as ibuprofen and naproxen with this medicine. Side effects such as stomach upset, nausea, or ulcers may be more likely to occur. Many medicines available without a prescription should not be taken with this medicine. This medicine can cause ulcers and bleeding in the stomach and intestines at any time during treatment. Do not smoke cigarettes or drink alcohol. These increase irritation to your stomach and can make it more susceptible to damage from this medicine. Ulcers and bleeding can happen without warning symptoms and can cause death. You may get drowsy or dizzy. Do not drive, use machinery, or do anything that needs mental alertness until you know how this  medicine affects you. Do not stand or sit up quickly, especially if you are an older patient. This reduces the risk of dizzy or fainting spells. This medicine can cause you to bleed more easily. Try to avoid damage to your teeth and gums when you brush or floss your teeth. What side effects may I notice from receiving this medicine? Side effects that you should report to your doctor or health care professional as soon as possible: -allergic reactions like skin rash, itching or hives, swelling of the face, lips, or tongue -breathing problems -high blood pressure -nausea, vomiting -redness, blistering, peeling or loosening of the skin, including inside the mouth -severe stomach pain -signs and symptoms of bleeding such as bloody or black, tarry stools; red or dark-brown urine; spitting up blood or brown material that looks like coffee grounds; red spots on the skin; unusual bruising or bleeding from the eye, gums, or nose -signs and symptoms of a stroke like changes in vision; confusion; trouble speaking or understanding; severe headaches; sudden numbness or weakness of the face, arm or leg; trouble walking; dizziness; loss of balance or coordination -trouble passing urine or change in the amount of urine -unexplained weight gain or swelling -unusually weak or tired -yellowing of eyes or skin Side effects that usually do not require medical attention (report to your doctor or health care professional if they continue or are bothersome): -diarrhea -dizziness -headache -heartburn This list may not describe all possible side effects. Call your doctor for medical advice about side effects. You may report side effects to  FDA at 1-800-FDA-1088. Where should I keep my medicine? Keep out of the reach of children. Store at room temperature between 20 and 25 degrees C (68 and 77 degrees F). Throw away any unused medicine after the expiration date. NOTE: This sheet is a summary. It may not cover all  possible information. If you have questions about this medicine, talk to your doctor, pharmacist, or health care provider.  2018 Elsevier/Gold Standard (2013-05-02 16:36:05)

## 2017-07-30 NOTE — Progress Notes (Signed)
GYNECOLOGY  VISIT   HPI: 21 y.o.   Single  African American  female   [place] with Patient's last menstrual period was 06/30/2017.   here for painful cramps before menstrual cycle. Also has cramping with her cycle.  Mother present for the entire visit.  Was on combined OCPs prior to IUD insertion.  The OCPs helped the cramping.  Thinks she was just starting to have visual aura with her migraines in May 2018.  Taking some ibuprofen 800 mg every 8 hours but only gets relief for 3 - 4 hours.  Also tried extra strength Tylenol whicd is not helping.  Heating pad not working well.   Can't sleep or do her usual activities due to the pain.   Her first cycle post IUD insertion was not so uncomfortable.   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   GYNECOLOGIC HISTORY: Patient's last menstrual period was 06/30/2017. Contraception:  Verdia Kuba IUD -- inserted 06/04/17  Menopausal hormone therapy:  n/a Last mammogram:  n/a Last pap smear:   05/31/17 Pap smear normal        OB History    Gravida Para Term Preterm AB Living   0 0 0 0 0 0   SAB TAB Ectopic Multiple Live Births   0 0 0 0 0         Patient Active Problem List   Diagnosis Date Noted  . Moderate persistent asthma without complication 23/55/7322  . Gastroesophageal reflux disease without esophagitis 05/11/2016  . Allergic rhinitis 12/18/2015  . Mild persistent asthma 12/18/2015  . GERD (gastroesophageal reflux disease) 12/18/2015  . 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 ADD (attention deficit disorder) 2013  . Asthma   . Dysmenorrhea   . Migraines    with aura  . Thrombocytosis ([place]) 2018   [id number] on 05/31/17    Past Surgical History:  Procedure Laterality Date  . OTHER SURGICAL HISTORY     hematoma surgery on head as an infant  . TONSILLECTOMY AND ADENOIDECTOMY    . WISDOM TOOTH EXTRACTION      Current Outpatient  Prescriptions  Medication Sig Dispense Refill  . Azelastine-Fluticasone (DYMISTA) 137-50 MCG/ACT SUSP INHALE 1 TO 2 SPRAYS IN EACH NOSTRIL TWICE DAILY FOR STUFFY NOSE OR CONGESTION 23 Bottle 5  . beclomethasone (QVAR) 80 MCG/ACT inhaler Inhale 2 puffs into the lungs 2 (two) times daily. 1 Inhaler 5  . EPINEPHrine (EPIPEN 2-PAK) 0.3 mg/0.3 mL IJ SOAJ injection INJECT INTRAMUSCULARLY AS DIRECTED FOR SEVERE LIFE-THREATENING ALLERGIC REACTION.    . frovatriptan (FROVA) 2.5 MG tablet Take one tablet (2.5 mg total) by mouth as needed for Migraine. If recurs, may repeat after 2 hours. Max of 3 tabs in 24 hours.    Marland Kitchen ibuprofen (ADVIL,MOTRIN) 800 MG tablet Take 1 tablet (800 mg total) by mouth every 8 (eight) hours as needed. 30 tablet 2  . lansoprazole (PREVACID) 30 MG capsule Take 1 capsule (30 mg total) by mouth daily. 90 capsule 3  . levalbuterol (XOPENEX HFA) 45 MCG/ACT inhaler Inhale 2 puffs into the lungs every 6 (six) hours as needed for wheezing. 1 Inhaler 1  . levalbuterol (XOPENEX) 1.25 MG/3ML nebulizer solution Take 1 ampule by nebulization every 4 (four) hours as needed.    Marland Kitchen levocetirizine (XYZAL) 5 MG tablet Take 1 tablet (5 mg total) by mouth daily. 90 tablet 3  . methylphenidate 27 MG  PO CR tablet Take by mouth.    . montelukast (SINGULAIR) 10 MG tablet Take 1 tablet (10 mg total) by mouth at bedtime. 90 tablet 3  . tretinoin (RETIN-A) 0.1 % cream Apply topically at bedtime.    Marland Kitchen zonisamide (ZONEGRAN) 100 MG capsule Take by mouth.     No current facility-administered medications for this visit.      ALLERGIES: Amoxicillin-pot clavulanate; Doxycycline; Peanut oil; and Peanut-containing drug products  Family History  Problem Relation Age of Onset  . Diabetes Mother   . Colon cancer Maternal Grandmother   . Diabetes Maternal Grandmother   . Lung cancer Maternal Grandfather   . Stroke Paternal Grandmother   . Breast cancer Maternal Aunt   . Pancreatic cancer Paternal Uncle      Social History   Social History  . Marital status: Single    Spouse name: N/A  . Number of children: N/A  . Years of education: N/A   Occupational History  . Not on file.   Social History Main Topics  . Smoking status: Never Smoker  . Smokeless tobacco: Never Used  . Alcohol use No  . Drug use: No  . Sexual activity: No     Comment: never sexually active   Other Topics Concern  . Not on file   Social History Narrative  . No narrative on file    ROS:  Pertinent items are noted in HPI.  PHYSICAL EXAMINATION:    BP 118/62 (BP Location: Right Arm, Patient Position: Sitting, Cuff Size: Normal)   Pulse 88   Temp 98.1 F (36.7 C) (Oral)   Resp 16   Wt 125 lb (56.7 kg)   LMP 06/30/2017   BMI 23.81 kg/m     General appearance: alert, cooperative and appears stated age   Pelvic: External genitalia:  no lesions              Urethra:  normal appearing urethra with no masses, tenderness or lesions              Bartholins and Skenes: normal                 Vagina: normal appearing vagina with normal color and discharge, no lesions              Cervix: no lesions.  IUD strings noted.                 Bimanual Exam:  Uterus:  normal size, contour, position, consistency, mobility, non-tender              Adnexa: no mass, fullness, tenderness               Chaperone was present for exam.  ASSESSMENT  Cramping prior to menses. Hx of dysmenorrhea and menorrhagia. Status post Verdia Kuba IUD insertion.  Migraine with aura.   PLAN  Will continue with IUD.  Will add Micronor.  Instructed in use.   3 packs and 3 refills.  Toradol 10 mg po q 6 hours prn.   Follow up yearly and prn.    An After Visit Summary was printed and given to the patient.  ___25___ minutes face to face time of which over 50% was spent in counseling.

## 2017-07-30 NOTE — Telephone Encounter (Signed)
She may start the Micronor today.  There are no placebo pills in the pack, so she needs to take one pill every day of the year.

## 2017-08-09 ENCOUNTER — Ambulatory Visit: Payer: BC Managed Care – PPO | Admitting: Pediatrics

## 2018-02-25 ENCOUNTER — Ambulatory Visit: Payer: BC Managed Care – PPO | Admitting: Allergy & Immunology

## 2018-02-25 ENCOUNTER — Encounter: Payer: Self-pay | Admitting: Allergy & Immunology

## 2018-02-25 VITALS — BP 110/80 | HR 76 | Temp 98.1°F | Resp 20 | Ht 61.0 in | Wt 100.8 lb

## 2018-02-25 DIAGNOSIS — J3089 Other allergic rhinitis: Secondary | ICD-10-CM

## 2018-02-25 DIAGNOSIS — T7800XD Anaphylactic reaction due to unspecified food, subsequent encounter: Secondary | ICD-10-CM

## 2018-02-25 DIAGNOSIS — J4530 Mild persistent asthma, uncomplicated: Secondary | ICD-10-CM

## 2018-02-25 MED ORDER — MONTELUKAST SODIUM 10 MG PO TABS: ORAL_TABLET | ORAL | 3 refills | Status: DC

## 2018-02-25 MED ORDER — AZELASTINE-FLUTICASONE 137-50 MCG/ACT NA SUSP: NASAL | 5 refills | Status: DC

## 2018-02-25 MED ORDER — LEVALBUTEROL TARTRATE 45 MCG/ACT IN AERO: 2.0000 | INHALATION_SPRAY | Freq: Four times a day (QID) | RESPIRATORY_TRACT | 1 refills | Status: DC | PRN

## 2018-02-25 MED ORDER — LANSOPRAZOLE 30 MG PO CPDR: DELAYED_RELEASE_CAPSULE | ORAL | 3 refills | Status: DC

## 2018-02-25 MED ORDER — LEVOCETIRIZINE DIHYDROCHLORIDE 5 MG PO TABS: ORAL_TABLET | ORAL | 3 refills | Status: DC

## 2018-02-25 MED ORDER — LEVALBUTEROL HCL 1.25 MG/3ML IN NEBU: 1.2500 mg | INHALATION_SOLUTION | Freq: Four times a day (QID) | RESPIRATORY_TRACT | 0 refills | Status: DC | PRN

## 2018-02-25 MED ORDER — EPINEPHRINE 0.3 MG/0.3ML IJ SOAJ: INTRAMUSCULAR | 1 refills | Status: DC

## 2018-02-25 NOTE — Patient Instructions (Addendum)
1. Mild persistent asthma, uncomplicated - Lung testing today looked great. - We will change your Qvar to use only with illnesses (viral upper respiratory infections, wheezing, coughing) - Daily controller medication(s): Singulair 10mg  daily - Prior to physical activity: ProAir 2 puffs 10-15 minutes before physical activity. - Rescue medications: ProAir 4 puffs every 4-6 hours as needed - Changes during respiratory infections or worsening symptoms: Increase Qvar 68mcg to 4 puffs twice daily for TWO WEEKS. - Asthma control goals:  * Full participation in all desired activities (may need albuterol before activity) * Albuterol use two time or less a week on average (not counting use with activity) * Cough interfering with sleep two time or less a month * Oral steroids no more than once a year * No hospitalizations  2. Anaphylactic shock due to food (peanuts) - Continue to avoid peanuts.  - We will send in a prescription for AuviQ (epinephrine)  3. Allergic rhinitis - Stop the Xyzal and start Allegra (fexofenadine) 180mg  tablet once daily. - Let us know if this helps and we can send in prescription for this. - You could also double up on the Xyzal twice daily to see if this helps (if you cannot tolerate the Allegra sized tablets)   - Continue with Singulair 10mg  daily. - Continue with Dymista, but increase to two sprays per nostril twice daily.  4. Return in about 6 months (around 08/28/2018).   Please inform us of any Emergency Department visits, hospitalizations, or changes in symptoms. Call us before going to the ED for breathing or allergy symptoms since we might be able to fit you in for a sick visit. Feel free to contact us anytime with any questions, problems, or concerns.  It was a pleasure to meet you today! 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   Websites that have reliable patient information: 1. American Academy of Asthma, Allergy, and Immunology:  www.aaaai.org 2. Food Allergy Research and Education (FARE): foodallergy.org 3. Mothers of Asthmatics: http://www.asthmacommunitynetwork.org 4. American College of Allergy, Asthma, and Immunology: www.acaai.org

## 2018-02-25 NOTE — Progress Notes (Addendum)
FOLLOW UP  Date of Service/Encounter:  02/25/18   Assessment:   Mild persistent asthma, uncomplicated  Anaphylactic shock due to food (peanuts)  Allergic rhinitis - s/p 5+ years of immunotherapy when she was in middle/high school   Asthma Reportables:  Severity: mild persistent  Risk: low Control: well controlled   Plan/Recommendations:   1. Mild persistent asthma, uncomplicated - Lung testing today looked great. - We will change your Qvar to use only with illnesses (viral upper respiratory infections, wheezing, coughing) - Daily controller medication(s): Singulair 10mg  daily - Prior to physical activity: ProAir 2 puffs 10-15 minutes before physical activity. - Rescue medications: ProAir 4 puffs every 4-6 hours as needed - Changes during respiratory infections or worsening symptoms: Increase Qvar 61mcg to 4 puffs twice daily for TWO WEEKS. - Asthma control goals:  * Full participation in all desired activities (may need albuterol before activity) * Albuterol use two time or less a week on average (not counting use with activity) * Cough interfering with sleep two time or less a month * Oral steroids no more than once a year * No hospitalizations  2. Anaphylactic shock due to food (peanuts) - Continue to avoid peanuts.  - We will get testing for peanuts today to see where her allergy levels are sitting at this time. - We will send in a prescription for AuviQ (epinephrine)  3. Allergic rhinitis - Stop the Xyzal and start Allegra (fexofenadine) 180mg  tablet once daily. - Let us know if this helps and we can send in prescription for this. - You could also double up on the Xyzal twice daily to see if this helps (if you cannot tolerate the Allegra sized tablets)   - Continue with Singulair 10mg  daily. - Continue with Dymista, but increase to two sprays per nostril twice daily. - We could consider resting and starting allergen immunotherapy again in the future if needed.     4. Return in about 6 months (around 08/28/2018).  Subjective:   Stephanie Simpson is a 22 y.o. female presenting today for follow up of  Chief Complaint  Patient presents with  . Allergic Rhinitis   . Asthma    Stephanie Simpson has a history of the following: Patient Active Problem List   Diagnosis Date Noted  . Moderate persistent asthma without complication 06/20/7627  . Gastroesophageal reflux disease without esophagitis 05/11/2016  . Allergic rhinitis 12/18/2015  . Mild persistent asthma 12/18/2015  . GERD (gastroesophageal reflux disease) 12/18/2015  . Anaphylactic shock due to adverse food reaction 12/18/2015    History obtained from: chart review and patient and her mother.  [facility] Primary Care Provider is Marda Stalker, PA-C.     Stephanie Simpson is a 22 y.o. female presenting for a follow up visit. She was last seen in December 2017 by Dr. Shaune Leeks. At that time, she was doing well. She was continued on all of her medications including Qvar 72mcg two puffs twice daily. She was also continued on Dymista 1-2 sprays per nostril daily as needed, montelukast 10mg  daily, and her lansoprazole 30mg  once daily. EpiPen was refilled, as she has a history of a peanut allergy.   Since the last visit, she has done well. She is now Engineer, production at [employer]. 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 She is planning to go to graduate school after this.   Asthma/Respiratory Symptom History: Stephanie Simpson's asthma has been well controlled. She has not required rescue medication,  experienced nocturnal awakenings due to lower respiratory symptoms, nor have activities of daily living been limited. She has required no Emergency Department or Urgent Care visits for her asthma. She has required zero courses of systemic steroids for asthma exacerbations since the last visit. ACT score today is 25, indicating excellent asthma symptom control.   Allergic Rhinitis Symptom History:  She has been having worsening symptoms with regards to her allergies. She is now living in [place] and is reporting chronic rhinorrhea. She is using her Dymista, although only once daily. She is using one Xyzal daily as well as montelukast. There is some confusion about her nasal spray, since her mother thinks that she is on a different medication. However, [name] is able to identify Dymista on the chart on the wall with absolutely certainty.   Otherwise, there have been no changes to her past medical history, surgical history, family history, or social history.    Review of Systems: a 14-point review of systems is pertinent for what is mentioned in HPI.  Otherwise, all other systems were negative. Constitutional: negative other than that listed in the HPI Eyes: negative other than that listed in the HPI Ears, nose, mouth, throat, and face: negative other than that listed in the HPI Respiratory: negative other than that listed in the HPI Cardiovascular: negative other than that listed in the HPI Gastrointestinal: negative other than that listed in the HPI Genitourinary: negative other than that listed in the HPI Integument: negative other than that listed in the HPI Hematologic: negative other than that listed in the HPI Musculoskeletal: negative other than that listed in the HPI Neurological: negative other than that listed in the HPI Allergy/Immunologic: negative other than that listed in the HPI    Objective:   Blood pressure 110/80, pulse 76, temperature 98.1 F (36.7 C), temperature source Oral, resp. rate 20, height 5\' 1"  (1.549 m), weight 100 lb 12 oz (45.7 kg), SpO2 95 %. Body mass index is 19.04 kg/m.   Physical Exam:  General: Alert, interactive, in no acute distress. Very pleasant and interactive.  Eyes: No conjunctival injection bilaterally, no discharge on the right, no discharge on the left and no Horner-Trantas dots present. PERRL bilaterally. EOMI without pain. No  photophobia.  Ears: Right TM pearly gray with normal light reflex, Left TM pearly gray with normal light reflex, Right TM intact without perforation and Left TM intact without perforation.  Nose/Throat: External nose within normal limits and septum midline. Turbinates edematous with clear discharge. Posterior oropharynx erythematous without cobblestoning in the posterior oropharynx. Tonsils 2+ without exudates.  Tongue without thrush. Lungs: Clear to auscultation without wheezing, rhonchi or rales. No increased work of breathing. CV: Normal S1/S2. No murmurs. Capillary refill <2 seconds.  Skin: Warm and dry, without lesions or rashes. Neuro:   Grossly intact. No focal deficits appreciated. Responsive to questions.  Diagnostic studies:   Spirometry: results normal (FEV1: 2.78/106%, FVC: 3.15/107%, FEV1/FVC: 88%).    Spirometry consistent with normal pattern.  Allergy Studies: none     Salvatore Marvel, MD [facility]

## 2018-03-01 LAB — IGE PEANUT COMPONENT PROFILE
F352-IGE ARA H 8: 2.17 kU/L — AB
F422-IGE ARA H 1: 8.08 kU/L — AB
F423-IGE ARA H 2: 18.9 kU/L — AB
F424-IgE Ara h 3: <0.1 kU/L
F427-IgE Ara h 9: <0.1 kU/L
F447-IGE ARA H 6: 10.4 kU/L — AB

## 2018-03-01 LAB — ALLERGEN, PEANUT F13: Peanut IgE: 24 kU/L — AB

## 2018-04-11 ENCOUNTER — Telehealth: Payer: Self-pay | Admitting: *Deleted

## 2018-04-11 NOTE — Telephone Encounter (Signed)
Mother called wanting lab results. Went over Dr. Bing Quarry note in lab results and also mailed copy to home address.

## 2018-07-05 ENCOUNTER — Other Ambulatory Visit: Payer: Self-pay | Admitting: Obstetrics and Gynecology

## 2018-07-06 NOTE — Telephone Encounter (Signed)
Medication refill request: Micronor  Last AEX:  07/30/17 office visit to add Micronor  Next AEX: 08/10/18 Last MMG (if hormonal medication request):NA Refill authorized: Please refill if appropriate.

## 2018-08-10 ENCOUNTER — Other Ambulatory Visit: Payer: Self-pay

## 2018-08-10 ENCOUNTER — Ambulatory Visit: Payer: BC Managed Care – PPO | Admitting: Obstetrics and Gynecology

## 2018-08-10 ENCOUNTER — Encounter: Payer: Self-pay | Admitting: Obstetrics and Gynecology

## 2018-08-10 VITALS — BP 110/66 | HR 80 | Resp 16 | Ht 61.0 in | Wt 113.8 lb

## 2018-08-10 DIAGNOSIS — D473 Essential (hemorrhagic) thrombocythemia: Secondary | ICD-10-CM

## 2018-08-10 DIAGNOSIS — Z01419 Encounter for gynecological examination (general) (routine) without abnormal findings: Secondary | ICD-10-CM | POA: Diagnosis not present

## 2018-08-10 DIAGNOSIS — D75839 Thrombocytosis, unspecified: Secondary | ICD-10-CM

## 2018-08-10 NOTE — Progress Notes (Signed)
22 y.o. G0P0000 Single African American female here for annual exam.    Does not really have a period anymore.  Can bleed for 2 days and then spotting for about 10 days following this.  Some cramping but no where near how it was.  Takes Ibuprofen 800 mg, which works to treat the pain.  No pain outside of cycle time.   Not sexually active since her last visit here.   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  PCP: Marda Stalker PA  Patient's last menstrual period was 08/01/2018.           Sexually active: No.  Female prefrenece.  The current method of family planning is IUD. Kyleena placed 06/04/17 Exercising: Yes.    walking, yoga Smoker:  no  Health Maintenance: Pap:  05/31/17 Neg  History of abnormal Pap:  no TDaP:  07/2018 Gardasil:  Completed  HIV: no Hep C: no Screening Labs: PCP   reports that she has never smoked. She has never used smokeless tobacco. She reports that she drinks about 1.0 - 2.0 standard drinks of alcohol per week. She reports that she does not use drugs.  Past Medical History:  Diagnosis Date  . ADD (attention deficit disorder) 2013  . Asthma   . Dysmenorrhea   . Migraines    with aura  . Thrombocytosis ([place]) 2018   [phone] on 05/31/17    Past Surgical History:  Procedure Laterality Date  . OTHER SURGICAL HISTORY     hematoma surgery on head as an infant  . TONSILLECTOMY AND ADENOIDECTOMY    . WISDOM TOOTH EXTRACTION      Current Outpatient Medications  Medication Sig Dispense Refill  . Azelastine-Fluticasone (DYMISTA) 137-50 MCG/ACT SUSP INHALE 1 TO 2 SPRAYS IN EACH NOSTRIL TWICE DAILY FOR STUFFY NOSE OR CONGESTION 23 Bottle 5  . beclomethasone (QVAR) 80 MCG/ACT inhaler Inhale 2 puffs into the lungs 2 (two) times daily. 1 Inhaler 5  . clindamycin (CLEOCIN T) 1 % external solution daily as needed.    Marland Kitchen EPINEPHrine (AUVI-Q) 0.3 mg/0.3 mL IJ SOAJ injection Use as directed for severe allergic reaction 2 Device 1  . EPINEPHrine  (EPIPEN 2-PAK) 0.3 mg/0.3 mL IJ SOAJ injection INJECT INTRAMUSCULARLY AS DIRECTED FOR SEVERE LIFE-THREATENING ALLERGIC REACTION.    Marland Kitchen ibuprofen (ADVIL,MOTRIN) 800 MG tablet Take 1 tablet (800 mg total) by mouth every 8 (eight) hours as needed. 30 tablet 2  . lansoprazole (PREVACID) 30 MG capsule 1 capsule daily for reflux. 90 capsule 3  . levalbuterol (XOPENEX HFA) 45 MCG/ACT inhaler Inhale 2 puffs into the lungs every 6 (six) hours as needed for wheezing. 1 Inhaler 1  . levalbuterol (XOPENEX) 1.25 MG/3ML nebulizer solution Take 1.25 mg by nebulization every 6 (six) hours as needed for wheezing. 72 mL 0  . levocetirizine (XYZAL) 5 MG tablet Take 1 tablet once daily as needed for runny nose. 90 tablet 3  . Levonorgestrel 19.5 MG IUD by Intrauterine route.    . methylphenidate 27 MG PO CR tablet Take by mouth.    . montelukast (SINGULAIR) 10 MG tablet Take 1 tablet once at night for coughing or wheezing. 90 tablet 3  . tretinoin (RETIN-A) 0.025 % cream Apply topically daily.    . frovatriptan (FROVA) 2.5 MG tablet Take one tablet (2.5 mg total) by mouth as needed for Migraine. If recurs, may repeat after 2 hours. Max of 3 tabs in 24 hours.    Marland Kitchen zonisamide (ZONEGRAN) 100  MG capsule Take by mouth.     No current facility-administered medications for this visit.     Family History  Problem Relation Age of Onset  . Diabetes Mother   . Colon cancer Maternal Grandmother   . Diabetes Maternal Grandmother   . Lung cancer Maternal Grandfather   . Stroke Paternal Grandmother   . Breast cancer Maternal Aunt   . Pancreatic cancer Paternal Uncle     Review of Systems  HENT:       Craving sweets   Gastrointestinal: Positive for constipation.  Genitourinary: Positive for menstrual problem.       Unscheduled bleeding or spotting  All other systems reviewed and are negative.   Exam:   BP 110/66 (BP Location: Right Arm, Patient Position: Sitting, Cuff Size: Normal)   Pulse 80   Resp 16   Ht 5\' 1"   (1.549 m)   Wt 113 lb 12.8 oz (51.6 kg)   LMP 08/01/2018   BMI 21.50 kg/m     General appearance: alert, cooperative and appears stated age Head: Normocephalic, without obvious abnormality, atraumatic Neck: no adenopathy, supple, symmetrical, trachea midline and thyroid normal to inspection and palpation Lungs: clear to auscultation bilaterally Breasts: normal appearance, no masses or tenderness, No nipple retraction or dimpling, No nipple discharge or bleeding, No axillary or supraclavicular adenopathy Heart: regular rate and rhythm Abdomen: soft, non-tender; no masses, no organomegaly Extremities: extremities normal, atraumatic, no cyanosis or edema Skin: Skin color, texture, turgor normal. No rashes or lesions Lymph nodes: Cervical, supraclavicular, and axillary nodes normal. No abnormal inguinal nodes palpated Neurologic: Grossly normal  Pelvic: External genitalia:  no lesions              Urethra:  normal appearing urethra with no masses, tenderness or lesions              Bartholins and Skenes: normal                 Vagina: normal appearing vagina with normal color and discharge, no lesions              Cervix: no lesions.  IUD strings noted.               Pap taken: No. Bimanual Exam:  Uterus:  normal size, contour, position, consistency, mobility, non-tender              Adnexa: no mass, fullness, tenderness           Chaperone was present for exam.  Assessment:   Well woman visit with normal exam. Irregular menses on Kyleena.  Patient satisfied with Verdia Kuba.  Hx migraine HA with aura.  Mildly elevated platelets.  Plan: Recommended self breast awareness. Pap and HR HPV as above. Guidelines for Calcium, Vitamin D, regular exercise program including cardiovascular and weight bearing exercise. Routine labs.  We discussed bleeding profiles with Kyleena.  Follow up annually and prn.    After visit summary provided.

## 2018-08-10 NOTE — Patient Instructions (Signed)

## 2018-08-11 LAB — COMPREHENSIVE METABOLIC PANEL
ALBUMIN: 4.3 g/dL (ref 3.5–5.5)
ALK PHOS: 105 IU/L (ref 39–117)
ALT: 14 IU/L (ref 0–32)
AST: 18 IU/L (ref 0–40)
Albumin/Globulin Ratio: 1.9 (ref 1.2–2.2)
BUN / CREAT RATIO: 16 (ref 9–23)
BUN: 13 mg/dL (ref 6–20)
Bilirubin Total: 0.3 mg/dL (ref 0.0–1.2)
CHLORIDE: 108 mmol/L — AB (ref 96–106)
CO2: 23 mmol/L (ref 20–29)
CREATININE: 0.79 mg/dL (ref 0.57–1.00)
Calcium: 9.3 mg/dL (ref 8.7–10.2)
GFR calc non Af Amer: 106 mL/min/{1.73_m2} (ref >59)
GFR, EST AFRICAN AMERICAN: 122 mL/min/{1.73_m2} (ref >59)
GLUCOSE: 69 mg/dL (ref 65–99)
Globulin, Total: 2.3 g/dL (ref 1.5–4.5)
Potassium: 4.2 mmol/L (ref 3.5–5.2)
Sodium: 143 mmol/L (ref 134–144)
TOTAL PROTEIN: 6.6 g/dL (ref 6.0–8.5)

## 2018-08-11 LAB — CBC
HEMATOCRIT: 39.2 % (ref 34.0–46.6)
Hemoglobin: 12.8 g/dL (ref 11.1–15.9)
MCH: 30.3 pg (ref 26.6–33.0)
MCHC: 32.7 g/dL (ref 31.5–35.7)
MCV: 93 fL (ref 79–97)
PLATELETS: 343 10*3/uL (ref 150–450)
RBC: 4.22 x10E6/uL (ref 3.77–5.28)
RDW: 13.5 % (ref 12.3–15.4)
WBC: 5.2 10*3/uL (ref 3.4–10.8)

## 2018-08-11 LAB — LIPID PANEL
CHOL/HDL RATIO: 2.2 ratio (ref 0.0–4.4)
Cholesterol, Total: 139 mg/dL (ref 100–199)
HDL: 63 mg/dL (ref >39)
LDL Calculated: 65 mg/dL (ref 0–99)
Triglycerides: 56 mg/dL (ref 0–149)
VLDL Cholesterol Cal: 11 mg/dL (ref 5–40)

## 2018-08-22 ENCOUNTER — Other Ambulatory Visit: Payer: Self-pay | Admitting: Allergy

## 2018-08-22 MED ORDER — LEVALBUTEROL TARTRATE 45 MCG/ACT IN AERO: 2.0000 | INHALATION_SPRAY | Freq: Four times a day (QID) | RESPIRATORY_TRACT | 1 refills | Status: DC | PRN

## 2018-08-22 MED ORDER — LEVALBUTEROL HCL 1.25 MG/3ML IN NEBU: 1.2500 mg | INHALATION_SOLUTION | Freq: Four times a day (QID) | RESPIRATORY_TRACT | 0 refills | Status: DC | PRN

## 2018-09-02 ENCOUNTER — Encounter: Payer: Self-pay | Admitting: Allergy & Immunology

## 2018-09-02 ENCOUNTER — Ambulatory Visit (INDEPENDENT_AMBULATORY_CARE_PROVIDER_SITE_OTHER): Payer: BC Managed Care – PPO | Admitting: Allergy & Immunology

## 2018-09-02 VITALS — BP 108/64 | HR 77 | Temp 98.0°F | Resp 16 | Ht 61.0 in | Wt 114.6 lb

## 2018-09-02 DIAGNOSIS — T7800XD Anaphylactic reaction due to unspecified food, subsequent encounter: Secondary | ICD-10-CM | POA: Diagnosis not present

## 2018-09-02 DIAGNOSIS — J4530 Mild persistent asthma, uncomplicated: Secondary | ICD-10-CM

## 2018-09-02 DIAGNOSIS — J3089 Other allergic rhinitis: Secondary | ICD-10-CM

## 2018-09-02 DIAGNOSIS — K219 Gastro-esophageal reflux disease without esophagitis: Secondary | ICD-10-CM | POA: Diagnosis not present

## 2018-09-02 MED ORDER — EPINEPHRINE 0.3 MG/0.3ML IJ SOAJ: INTRAMUSCULAR | 1 refills | Status: DC

## 2018-09-02 MED ORDER — LEVALBUTEROL HCL 1.25 MG/3ML IN NEBU: 1.2500 mg | INHALATION_SOLUTION | RESPIRATORY_TRACT | 1 refills | Status: DC | PRN

## 2018-09-02 MED ORDER — LEVALBUTEROL TARTRATE 45 MCG/ACT IN AERO: 2.0000 | INHALATION_SPRAY | Freq: Four times a day (QID) | RESPIRATORY_TRACT | 1 refills | Status: DC | PRN

## 2018-09-02 NOTE — Progress Notes (Signed)
FOLLOW UP  Date of Service/Encounter:  09/02/18   Assessment:   Mild persistent asthma, uncomplicated  Anaphylactic shock due to food (peanuts)  Allergic rhinitis - s/p 5+ years of immunotherapy when she was in middle/high school   Asthma Reportables:  Severity: mild persistent  Risk: low Control: well controlled   Plan/Recommendations:   1. Mild persistent asthma, uncomplicated - Lung testing today looked great. - Daily controller medication(s): Singulair 10mg  daily - Prior to physical activity: ProAir 2 puffs 10-15 minutes before physical activity. - Rescue medications: ProAir 4 puffs every 4-6 hours as needed - Changes during respiratory infections or worsening symptoms: Increase Qvar 80mcg to 4 puffs twice daily for TWO WEEKS. - Asthma control goals:  * Full participation in all desired activities (may need albuterol before activity) * Albuterol use two time or less a week on average (not counting use with activity) * Cough interfering with sleep two time or less a month * Oral steroids no more than once a year * No hospitalizations  2. Anaphylactic shock due to food (peanuts) - Continue to avoid peanuts.  - We will send in a prescription for AuviQ (epinephrine)  3. Allergic rhinitis - Continue with Xyzal 1-2 tablet daily.  - Continue with Singulair 10mg  daily. - Continue with Dymista, but increase up to two sprays per nostril twice daily.  4. Return in about 6 months (around 03/03/2019) in [place].   Subjective:   Stephanie Simpson is a 22 y.o. female presenting today for follow up of  Chief Complaint  Patient presents with  . Allergic Rhinitis   . Asthma    Macon J Dombeck has a history of the following: Patient Active Problem List   Diagnosis Date Noted  . Moderate persistent asthma without complication 62/56/3893  . Gastroesophageal reflux disease without esophagitis 05/11/2016  . Allergic rhinitis 12/18/2015  . Mild persistent asthma 12/18/2015   . GERD (gastroesophageal reflux disease) 12/18/2015  . Anaphylactic shock due to adverse food reaction 12/18/2015    History obtained from: chart review and patient and her mother.  [facility] Primary Care Provider is Marda Stalker, PA-C.     Eva is a 22 y.o. female presenting for a follow up visit.  I last saw her in March 2019.  At that time, her lung testing looked fantastic.  We changed her Qvar to only be used during respiratory flares.  We continued Singulair 10 mg daily as well as pro-air as needed.  During flares, she was instructed to use Qvar 4 puffs twice daily for 1 to 2 weeks.  She has a history of anaphylaxis to peanuts.  We sent in a prescription for Auvi-Q.  Her peanut component testing showed elevated IgE levels to the high risk parts of the peanut protein, so we recommended continued avoidance.  For her allergic rhinitis, we stopped her Xyzal and started Allegra 1 tablet once daily.  We continued her with Dymista but increased her to 2 sprays per nostril twice daily.  We did discuss allergen immunotherapy, but we wanted to get medications a try first.  Since the last visit, she has mostly done well. She reports that two weeks ago, she awoke in the middle of the night with chest tightness and wheezing. She has not had an asthma attack in quite some time. She went to go get her mother and they used some expired albuterol medication, which did provide some improvement. She finally found an inhaler that had not expired, but the  tightness continued for a couple of days after that. She did not get any prednisone at all. 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   Asthma/Respiratory Symptom History: Aside from her recent asthma exacerbation, her asthma has been under good control.  She has required no prednisone, emergency room visits, or urgent care visits for her asthma.  ACT score today is 11,  indicating poor asthma control.  However, this is reflective of her recent asthma exacerbation, which was treated with a rescue inhaler only.  Allergic Rhinitis Symptom History: Her symptoms calmed down after the last visit in March. But they flared up when she moved back again. She is currently taking Xyzal. She did try using the Allegra but was unsure if it provided any extra relief. She is using the Dymista one spray per nostril once daily.  She thinks that her allergies are worse since moving back to [place].  Food Allergy Symptom History: She continues to avoid peanuts.  She does eat tree nuts without any problems.  She has had no accidental exposures to peanuts.  She does need a new Auvi-Q prescription.  Otherwise, there have been no changes to her past medical history, surgical history, family history, or social history.    Review of Systems: a 14-point review of systems is pertinent for what is mentioned in HPI.  Otherwise, all other systems were negative. Constitutional: negative other than that listed in the HPI Eyes: negative other than that listed in the HPI Ears, nose, mouth, throat, and face: negative other than that listed in the HPI Respiratory: negative other than that listed in the HPI Cardiovascular: negative other than that listed in the HPI Gastrointestinal: negative other than that listed in the HPI Genitourinary: negative other than that listed in the HPI Integument: negative other than that listed in the HPI Hematologic: negative other than that listed in the HPI Musculoskeletal: negative other than that listed in the HPI Neurological: negative other than that listed in the HPI Allergy/Immunologic: negative other than that listed in the HPI    Objective:   Blood pressure 108/64, pulse 77, temperature 98 F (36.7 C), temperature source Oral, resp. rate 16, height 5\' 1"  (1.549 m), weight 114 lb 10.2 oz (52 kg), SpO2 99 %. Body mass index is 21.66  kg/m.   Physical Exam:  General: Alert, interactive, in no acute distress. Talkative and pleasant. Clearly has a plan in place.  Eyes: No conjunctival injection bilaterally, no discharge on the right, no discharge on the left, no Horner-Trantas dots present and allergic shiners present bilaterally. PERRL bilaterally. EOMI without pain. No photophobia.  Ears: Right TM pearly gray with normal light reflex, Left TM pearly gray with normal light reflex, Right TM intact without perforation and Left TM intact without perforation.  Nose/Throat: External nose within normal limits and septum midline. Turbinates edematous and pale with clear discharge. Posterior oropharynx erythematous without cobblestoning in the posterior oropharynx. Tonsils 2+ without exudates.  Tongue without thrush. Lungs: Clear to auscultation without wheezing, rhonchi or rales. No increased work of breathing. CV: Normal S1/S2. No murmurs. Capillary refill <2 seconds.  Skin: Warm and dry, without lesions or rashes. Neuro:   Grossly intact. No focal deficits appreciated. Responsive to questions.  Diagnostic studies:   Spirometry: results normal (FEV1: 2.62/101%, FVC: 3.14/106%, FEV1/FVC: 83%).    Spirometry consistent with normal pattern.   Allergy Studies: none    Salvatore Marvel, MD  [facility]

## 2018-09-02 NOTE — Patient Instructions (Addendum)
1. Mild persistent asthma, uncomplicated - Lung testing today looked great. - Daily controller medication(s): Singulair 10mg  daily - Prior to physical activity: ProAir 2 puffs 10-15 minutes before physical activity. - Rescue medications: ProAir 4 puffs every 4-6 hours as needed - Changes during respiratory infections or worsening symptoms: Increase Qvar 1mcg to 4 puffs twice daily for TWO WEEKS. - Asthma control goals:  * Full participation in all desired activities (may need albuterol before activity) * Albuterol use two time or less a week on average (not counting use with activity) * Cough interfering with sleep two time or less a month * Oral steroids no more than once a year * No hospitalizations  2. Anaphylactic shock due to food (peanuts) - Continue to avoid peanuts.  - We will send in a prescription for AuviQ (epinephrine)  3. Allergic rhinitis - Continue with Xyzal 1-2 tablet daily.  - Continue with Singulair 10mg  daily. - Continue with Dymista, but increase up to two sprays per nostril twice daily.  4. Return in about 6 months (around 03/03/2019) in [place].    Please inform us of any Emergency Department visits, hospitalizations, or changes in symptoms. Call us before going to the ED for breathing or allergy symptoms since we might be able to fit you in for a sick visit. Feel free to contact us anytime with any questions, problems, or concerns.  It was a pleasure to see you and your family again today!  Websites that have reliable patient information: 1. American Academy of Asthma, Allergy, and Immunology: www.aaaai.org 2. Food Allergy Research and Education (FARE): foodallergy.org 3. Mothers of Asthmatics: http://www.asthmacommunitynetwork.org 4. American College of Allergy, Asthma, and Immunology: MonthlyElectricBill.co.uk   Make sure you are registered to vote! If you have moved or changed any of your contact information, you will need to get this updated before  voting!

## 2018-09-26 ENCOUNTER — Other Ambulatory Visit: Payer: Self-pay | Admitting: Allergy

## 2018-09-26 MED ORDER — FEXOFENADINE HCL 180 MG PO TABS: 180.0000 mg | ORAL_TABLET | Freq: Every day | ORAL | 1 refills | Status: DC

## 2018-09-26 MED ORDER — MONTELUKAST SODIUM 10 MG PO TABS: ORAL_TABLET | ORAL | 1 refills | Status: DC

## 2018-11-03 DIAGNOSIS — L438 Other lichen planus: Secondary | ICD-10-CM | POA: Insufficient documentation

## 2019-03-01 ENCOUNTER — Ambulatory Visit: Payer: BC Managed Care – PPO | Admitting: Allergy & Immunology

## 2019-03-02 ENCOUNTER — Ambulatory Visit: Payer: BC Managed Care – PPO | Admitting: Allergy & Immunology

## 2019-03-02 ENCOUNTER — Encounter: Payer: Self-pay | Admitting: Allergy & Immunology

## 2019-03-02 DIAGNOSIS — T7800XD Anaphylactic reaction due to unspecified food, subsequent encounter: Secondary | ICD-10-CM

## 2019-03-02 DIAGNOSIS — J302 Other seasonal allergic rhinitis: Secondary | ICD-10-CM

## 2019-03-02 DIAGNOSIS — J3089 Other allergic rhinitis: Secondary | ICD-10-CM

## 2019-03-02 DIAGNOSIS — J4530 Mild persistent asthma, uncomplicated: Secondary | ICD-10-CM

## 2019-03-02 MED ORDER — LEVALBUTEROL HCL 1.25 MG/3ML IN NEBU: 1.2500 mg | INHALATION_SOLUTION | RESPIRATORY_TRACT | 1 refills | Status: DC | PRN

## 2019-03-02 MED ORDER — LEVALBUTEROL TARTRATE 45 MCG/ACT IN AERO: 2.0000 | INHALATION_SPRAY | Freq: Four times a day (QID) | RESPIRATORY_TRACT | 1 refills | Status: DC | PRN

## 2019-03-02 MED ORDER — BECLOMETHASONE DIPROPIONATE 80 MCG/ACT IN AERS: 2.0000 | INHALATION_SPRAY | Freq: Two times a day (BID) | RESPIRATORY_TRACT | 5 refills | Status: DC

## 2019-03-02 MED ORDER — FEXOFENADINE HCL 180 MG PO TABS: 180.0000 mg | ORAL_TABLET | Freq: Every day | ORAL | 1 refills | Status: DC

## 2019-03-02 MED ORDER — MONTELUKAST SODIUM 10 MG PO TABS: ORAL_TABLET | ORAL | 1 refills | Status: DC

## 2019-03-02 MED ORDER — FLUTICASONE PROPIONATE 93 MCG/ACT NA EXHU: 2.0000 | INHALANT_SUSPENSION | Freq: Two times a day (BID) | NASAL | 3 refills | Status: DC

## 2019-03-02 NOTE — Progress Notes (Signed)
FOLLOW UP  Date of Service/Encounter:  03/02/19   Assessment:   Mild persistent asthma, uncomplicated  Anaphylactic shock due to food(peanuts)  Allergic rhinitis- s/p 5+ years of immunotherapy when she was in middle/high school   Asthma Reportables:  Severity: mild persistent  Risk: low Control: well controlled  Plan/Recommendations:   1. Mild persistent asthma, uncomplicated - Lung testing today looked great. - We are not going to make any changes at this time.  - Daily controller medication(s): Singulair 10mg  daily  - Prior to physical activity: Xopenex 2 puffs 10-15 minutes before physical activity. - Rescue medications: Xopenex 4 puffs every 4-6 hours as needed - Changes during respiratory infections or worsening symptoms: Increase Qvar 1mcg to 4 puffs twice daily for TWO WEEKS. - Asthma control goals:  * Full participation in all desired activities (may need albuterol before activity) * Albuterol use two time or less a week on average (not counting use with activity) * Cough interfering with sleep two time or less a month * Oral steroids no more than once a year * No hospitalizations  2. Anaphylactic shock due to food (peanuts) - Continue to avoid peanuts.  - We will send in a prescription for AuviQ (epinephrine).  3. Allergic rhinitis - Try changing to Zyrtec (ceteririzine) or Allegra (fexaofenadine) instead of the Xyzal.  - Hopefully this will provide some better relief of your symptoms.  - Continue with Singulair 10mg  daily. - Stop the Dymista and start Xhance two sprays per nostril twice daily. - We will send in the script to the [facility] outpatient pharmacy, and they will call you to confirm your shipping address. - You can review how to use the device here: [email or web address] - Ask to be enrolled in the auto-refill program so you can get a year for free.  4. Return in about 4 months (around 07/02/2019).  Subjective:   Stephanie Simpson is a 23 y.o.  female presenting today for follow up of  Chief Complaint  Patient presents with  . Asthma    Stephanie Simpson has a history of the following: Patient Active Problem List   Diagnosis Date Noted  . Moderate persistent asthma without complication 78/24/2353  . Gastroesophageal reflux disease without esophagitis 05/11/2016  . Allergic rhinitis 12/18/2015  . Mild persistent asthma 12/18/2015  . GERD (gastroesophageal reflux disease) 12/18/2015  . Anaphylactic shock due to adverse food reaction 12/18/2015    History obtained from: chart review and patient and mother.  Stephanie Simpson is a 23 y.o. female presenting for a follow up visit. He was last seen in September 2019. At that time, lung testing was good. We continued with Singulair 10mg  daily as well as Xopenex as needed. We recommended continued avoidance of peanuts and we refilled her AuviQ. For her allergic rhinitis, we continued her on Xyzal 1-2 times daily as well as Singulair and Dymista.   Asthma/Respiratory Symptom History: She remains on the Singulair 10mg  daily. She does have Xopenex on hand to use as needed. She has had no problems whatsoever. Her breathing overall has been very well controlled. She has had no prednisone or ED visits. ACT is 24, indicating excellent asthma control.   Allergic Rhinitis Symptom History: She remains on her Xyzal, [place], and Dymista. She reports that her allergies are not well controlled. Ever since returning from [place], she has been having worsening allergy symptoms. She is wondering whether there are any other medications that could be tried.  Food Allergy Symptom History: She remains on  her diet of peanut avoidance. She has an up to date AuviQ. She has had no problems with accidental exposures. She is not interested in retesting at all. Her last testing was performed one year ago and was positive to Ara h 1, 2, 6, and 8.      Otherwise, there have been no changes to her past medical history, surgical  history, family history, or social history.    Review of Systems  Constitutional: Negative.  Negative for fever, malaise/fatigue and weight loss.  HENT: Positive for congestion. Negative for ear discharge, ear pain and sinus pain.        Positive for rhinorrhea.  Eyes: Negative for pain, discharge and redness.  Respiratory: Negative for cough, sputum production, shortness of breath and wheezing.   Cardiovascular: Negative.  Negative for chest pain and palpitations.  Gastrointestinal: Negative for abdominal pain, heartburn, nausea and vomiting.  Skin: Negative.  Negative for itching and rash.  Neurological: Negative for dizziness and headaches.  Endo/Heme/Allergies: Positive for environmental allergies. Does not bruise/bleed easily.       Objective:   Vitals all within normal limits  Physical Exam:  Physical Exam  Constitutional: She appears well-developed.  HENT:  Head: Normocephalic and atraumatic.  Right Ear: Tympanic membrane, external ear and ear canal normal.  Left Ear: Tympanic membrane and ear canal normal.  Nose: Rhinorrhea present. No mucosal edema, nasal deformity or septal deviation. No epistaxis. Right sinus exhibits no maxillary sinus tenderness and no frontal sinus tenderness. Left sinus exhibits no maxillary sinus tenderness and no frontal sinus tenderness.  Mouth/Throat: Uvula is midline and oropharynx is clear and moist. Mucous membranes are not pale and not dry.  No cerumen present. TMs have a perfect light reflex. Turbinates are edematous with clear discharge present.   Eyes: Pupils are equal, round, and reactive to light. Conjunctivae and EOM are normal. Right eye exhibits no chemosis and no discharge. Left eye exhibits no chemosis and no discharge. Right conjunctiva is not injected. Left conjunctiva is not injected.  Cardiovascular: Normal rate, regular rhythm and normal heart sounds.  Respiratory: Effort normal and breath sounds normal. No accessory muscle  usage. No tachypnea. No respiratory distress. She has no wheezes. She has no rhonchi. She has no rales. She exhibits no tenderness.  Lymphadenopathy:    She has no cervical adenopathy.  Neurological: She is alert.  Skin: No abrasion, no petechiae and no rash noted. Rash is not papular, not vesicular and not urticarial. No erythema. No pallor.  Psychiatric: She has a normal mood and affect.     Diagnostic studies:    Spirometry: results normal (FEV1: 2.70/109%, FVC: 3.13/111%, FEV1/FVC: 86%).    Spirometry consistent with normal pattern.   Allergy Studies: none      Salvatore Marvel, MD  [facility]

## 2019-03-02 NOTE — Patient Instructions (Addendum)
1. Mild persistent asthma, uncomplicated - Lung testing today looked great. - We are not going to make any changes at this time.  - Daily controller medication(s): Singulair 10mg  daily  - Prior to physical activity: Xopenex 2 puffs 10-15 minutes before physical activity. - Rescue medications: Xopenex 4 puffs every 4-6 hours as needed - Changes during respiratory infections or worsening symptoms: Increase Qvar 84mcg to 4 puffs twice daily for TWO WEEKS. - Asthma control goals:  * Full participation in all desired activities (may need albuterol before activity) * Albuterol use two time or less a week on average (not counting use with activity) * Cough interfering with sleep two time or less a month * Oral steroids no more than once a year * No hospitalizations  2. Anaphylactic shock due to food (peanuts) - Continue to avoid peanuts.  - We will send in a prescription for AuviQ (epinephrine).  3. Allergic rhinitis - Try changing to Zyrtec (ceteririzine) or Allegra (fexaofenadine) instead of the Xyzal.  - Hopefully this will provide some better relief of your symptoms.  - Continue with Singulair 10mg  daily. - Stop the Dymista and start Xhance two sprays per nostril twice daily. - We will send in the script to the [facility] outpatient pharmacy, and they will call you to confirm your shipping address. - You can review how to use the device here: [email or web address] - Ask to be enrolled in the auto-refill program so you can get a year for free.  4. Return in about 4 months (around 07/02/2019).   Please inform us of any Emergency Department visits, hospitalizations, or changes in symptoms. Call us before going to the ED for breathing or allergy symptoms since we might be able to fit you in for a sick visit. Feel free to contact us anytime with any questions, problems, or concerns.   It was a pleasure to see you and your family again today! 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   Websites  that have reliable patient information: 1. American Academy of Asthma, Allergy, and Immunology: www.aaaai.org 2. Food Allergy Research and Education (FARE): foodallergy.org 3. Mothers of Asthmatics: http://www.asthmacommunitynetwork.org 4. American College of Allergy, Asthma, and Immunology: MonthlyElectricBill.co.uk   Make sure you are registered to vote! If you have moved or changed any of your contact information, you will need to get this updated before voting!    Voter ID laws are NOT going into effect for the General Election in November 2020! DO NOT let this stop you from exercising your right to vote!

## 2019-06-29 ENCOUNTER — Other Ambulatory Visit: Payer: Self-pay | Admitting: Allergy & Immunology

## 2019-07-04 ENCOUNTER — Other Ambulatory Visit: Payer: Self-pay

## 2019-07-04 ENCOUNTER — Ambulatory Visit: Payer: BC Managed Care – PPO | Admitting: Allergy & Immunology

## 2019-07-04 ENCOUNTER — Encounter: Payer: Self-pay | Admitting: Allergy & Immunology

## 2019-07-04 VITALS — BP 120/78 | HR 85 | Temp 98.1°F | Resp 16 | Ht 60.24 in | Wt 137.8 lb

## 2019-07-04 DIAGNOSIS — T7800XD Anaphylactic reaction due to unspecified food, subsequent encounter: Secondary | ICD-10-CM | POA: Diagnosis not present

## 2019-07-04 DIAGNOSIS — J4530 Mild persistent asthma, uncomplicated: Secondary | ICD-10-CM | POA: Diagnosis not present

## 2019-07-04 DIAGNOSIS — K219 Gastro-esophageal reflux disease without esophagitis: Secondary | ICD-10-CM

## 2019-07-04 DIAGNOSIS — J3089 Other allergic rhinitis: Secondary | ICD-10-CM

## 2019-07-04 DIAGNOSIS — J302 Other seasonal allergic rhinitis: Secondary | ICD-10-CM

## 2019-07-04 MED ORDER — MONTELUKAST SODIUM 10 MG PO TABS: ORAL_TABLET | ORAL | 3 refills | Status: DC

## 2019-07-04 MED ORDER — EPINEPHRINE 0.3 MG/0.3ML IJ SOAJ: INTRAMUSCULAR | 2 refills | Status: DC

## 2019-07-04 MED ORDER — LEVOCETIRIZINE DIHYDROCHLORIDE 5 MG PO TABS: ORAL_TABLET | ORAL | 3 refills | Status: DC

## 2019-07-04 NOTE — Patient Instructions (Addendum)
1. Mild persistent asthma, uncomplicated - Lung testing today looked great. - It seems that your current medications are working well.  - Daily controller medication(s): Singulair 10mg  daily  - Prior to physical activity: Xopenex 2 puffs 10-15 minutes before physical activity. - Rescue medications: Xopenex 4 puffs every 4-6 hours as needed - Changes during respiratory infections or worsening symptoms: Increase Qvar 45mcg to 4 puffs twice daily for TWO WEEKS. - Asthma control goals:  * Full participation in all desired activities (may need albuterol before activity) * Albuterol use two time or less a week on average (not counting use with activity) * Cough interfering with sleep two time or less a month * Oral steroids no more than once a year * No hospitalizations  2. Anaphylactic shock due to food (peanuts) - Continue to avoid peanuts.  - We will send in a prescription for AuviQ (epinephrine). - Information on OIT provided. - I will email you more information on the process.   3. Allergic rhinitis - Continue Allegra (fexaofenadine) in the morning. - Continue Xyzal at night. - Continue with Singulair 10mg  daily. - Continue with Xhance two sprays per nostril twice daily. - We will send in the script to the [facility] outpatient pharmacy, and they will call you to confirm your shipping address. - You can review how to use the device here: [email or web address] - Ask to be enrolled in the auto-refill program so you can get a year for free.  4. Return in about 6 months (around 01/04/2020). This can be an in-person, a virtual Webex or a telephone follow up visit.   Please inform us of any Emergency Department visits, hospitalizations, or changes in symptoms. Call us before going to the ED for breathing or allergy symptoms since we might be able to fit you in for a sick visit. Feel free to contact us anytime with any questions, problems, or concerns.  It was a pleasure to see you again today!    Websites that have reliable patient information: 1. American Academy of Asthma, Allergy, and Immunology: www.aaaai.org 2. Food Allergy Research and Education (FARE): foodallergy.org 3. Mothers of Asthmatics: http://www.asthmacommunitynetwork.org 4. American College of Allergy, Asthma, and Immunology: www.acaai.org  "Like" [place] on Facebook and Instagram for our latest updates!      Make sure you are registered to vote! If you have moved or changed any of your contact information, you will need to get this updated before voting!  In some cases, you MAY be able to register to vote online: [email or web address]    Voter ID laws are NOT going into effect for the General Election in November 2020! DO NOT let this stop you from exercising your right to vote!   Absentee voting is the SAFEST way to vote during the coronavirus pandemic!   Download and print an absentee ballot request form at [email or web address] or you can scan the QR code below with your smart phone:      More information on absentee ballots can be found here: [email or web address]

## 2019-07-04 NOTE — Progress Notes (Signed)
FOLLOW UP  Date of Service/Encounter:  07/04/19   Assessment:   Mild persistent asthma, uncomplicated  Anaphylactic shock due to food(peanuts) - interested in OIT  Allergic rhinitis- s/p 5+ years of immunotherapy when she was in middle/high school   Asthma Reportables:  Severity: mild persistent  Risk: low Control: well controlled   Plan/Recommendations:   1. Mild persistent asthma, uncomplicated - Lung testing today looked great. - It seems that your current medications are working well.  - Daily controller medication(s): Singulair 10mg  daily  - Prior to physical activity: Xopenex 2 puffs 10-15 minutes before physical activity. - Rescue medications: Xopenex 4 puffs every 4-6 hours as needed - Changes during respiratory infections or worsening symptoms: Increase Qvar 66mcg to 4 puffs twice daily for TWO WEEKS. - Asthma control goals:  * Full participation in all desired activities (may need albuterol before activity) * Albuterol use two time or less a week on average (not counting use with activity) * Cough interfering with sleep two time or less a month * Oral steroids no more than once a year * No hospitalizations  2. Anaphylactic shock due to food (peanuts) - Continue to avoid peanuts.  - We will send in a prescription for AuviQ (epinephrine). - Information on OIT provided. - I will email you more information on the process.   3. Allergic rhinitis - Continue Allegra (fexaofenadine) in the morning. - Continue Xyzal at night. - Continue with Singulair 10mg  daily. - Continue with Xhance two sprays per nostril twice daily. - We will send in the script to the [facility] outpatient pharmacy, and they will call you to confirm your shipping address. - You can review how to use the device here: [email or web address].[email or web address] - Ask to be enrolled in the auto-refill program so you can get a year for free.  4. Return in about 6 months (around 01/04/2020). This can be an  in-person, a virtual Webex or a telephone follow up visit.   Subjective:   Stephanie Simpson is a 23 y.o. female presenting today for follow up of  Chief Complaint  Patient presents with  . Asthma    doing ok  . Allergic Rhinitis     doing ok  . Food Intolerance    avoiding peanuts 100% tolerates tree nuts    Stephanie Simpson has a history of the following: Patient Active Problem List   Diagnosis Date Noted  . Moderate persistent asthma without complication 26/71/2458  . Gastroesophageal reflux disease without esophagitis 05/11/2016  . Allergic rhinitis 12/18/2015  . Mild persistent asthma 12/18/2015  . GERD (gastroesophageal reflux disease) 12/18/2015  . Anaphylactic shock due to adverse food reaction 12/18/2015    History obtained from: chart review and patient.  Stephanie Simpson is a 23 y.o. female presenting for a follow up visit.  She was last seen in March 2020.  At that time, her albuterol was controlled with Singulair 10 mg daily and Xopenex as needed.  She does have Qvar on hand add during respiratory flares.  She has a history of anaphylaxis to peanuts.  We sent in an Auvi-Q epinephrine autoinjector.  Her allergic rhinitis was not well controlled.  We changed her Xyzal to Zyrtec or Allegra.  We continued the Singulair 10 mg daily.  We stopped her Dymista and changed her to [facility] 2 sprays per nostril twice daily.  Since last visit, she has done well. 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   Asthma/Respiratory Symptom History: She is doing  well from an asthma perspective. She has not needed her Qvar at all. She has been compliant with her Singulair.  Allergic Rhinitis Symptom History: She is currently on Allegra in the morning and Xyzal at night. She is using the [place] and is a firm believer in it. This has controlled her symptoms very well. She knows that there is something about intact female cat urine that causes her to react but mostly recently did well with this.  Food  Allergy Symptom History: She continues to avoid peanuts. She has her Auvi-Q in hand just in case.  Otherwise, there have been no changes to her past medical history, surgical history, family history, or social history.  EMAIL: [email or web address]  Review of Systems  Constitutional: Negative.  Negative for fever, malaise/fatigue and weight loss.  HENT: Negative.  Negative for congestion, ear discharge, ear pain and sore throat.   Eyes: Negative for pain, discharge and redness.  Respiratory: Negative for cough, sputum production, shortness of breath and wheezing.   Cardiovascular: Negative.  Negative for chest pain and palpitations.  Gastrointestinal: Negative for abdominal pain, heartburn, nausea and vomiting.  Skin: Negative.  Negative for itching and rash.  Neurological: Negative for dizziness and headaches.  Endo/Heme/Allergies: Negative for environmental allergies. Does not bruise/bleed easily.       Objective:   Blood pressure 120/78, pulse 85, temperature 98.1 F (36.7 C), temperature source Temporal, resp. rate 16, height 5' 0.24" (1.53 m), weight 137 lb 12.8 oz (62.5 kg), last menstrual period 06/29/2019, SpO2 99 %. Body mass index is 26.7 kg/m.   Physical Exam:  Physical Exam  Constitutional: She appears well-developed.  Talkative and adorable.  HENT:  Head: Normocephalic and atraumatic.  Right Ear: Tympanic membrane, external ear and ear canal normal.  Left Ear: Tympanic membrane, external ear and ear canal normal.  Nose: Mucosal edema and rhinorrhea present. No nasal deformity or septal deviation. No epistaxis. Right sinus exhibits no maxillary sinus tenderness and no frontal sinus tenderness. Left sinus exhibits no maxillary sinus tenderness and no frontal sinus tenderness.  Mouth/Throat: Uvula is midline and oropharynx is clear and moist. Mucous membranes are not pale and not dry.  Eyes: Pupils are equal, round, and reactive to light. Conjunctivae and EOM are  normal. Right eye exhibits no chemosis and no discharge. Left eye exhibits no chemosis and no discharge. Right conjunctiva is not injected. Left conjunctiva is not injected.  Cardiovascular: Normal rate, regular rhythm and normal heart sounds.  Respiratory: Effort normal and breath sounds normal. No accessory muscle usage. No tachypnea. No respiratory distress. She has no wheezes. She has no rhonchi. She has no rales. She exhibits no tenderness.  Moving air well in all lung fields.  Lymphadenopathy:    She has no cervical adenopathy.  Neurological: She is alert.  Skin: No abrasion, no petechiae and no rash noted. Rash is not papular, not vesicular and not urticarial. No erythema. No pallor.  No eczematous or urticarial lesions noted.  Psychiatric: She has a normal mood and affect.     Diagnostic studies:    Spirometry: results normal (FEV1: 2.51/88%, FVC: 3.10/95%, FEV1/FVC: 81%).    Spirometry consistent with normal pattern.   Allergy Studies: none      Salvatore Marvel, MD  [facility] [place]

## 2019-08-09 NOTE — Progress Notes (Signed)
23 y.o. G0P0000 Single African American female here for annual exam.   Patient is having cramping with cycles more with [facility] for the last few months.  Starts cramping a week prior to her menses, during her menses, and after her cycle also. Ibuprofen cn be helpful.  Heating pad/bottle also helps.  Periods are shorter but has bleeding, pauses for a few days, and then restarts for a couple more days.   She has not been sexually active since her visit last year.  Wants regular blood work today.  Working at a [employer] features editor.  Would like to go to vet school.   PCP:  Marda Stalker, PA-C  Patient's last menstrual period was 08/01/2019 (exact date).           Sexually active: No.  The current method of family planning is IUD--Kyleena 6-8-18abstinence.    Exercising: Yes.    yoga and kickboxing Smoker:  no  Health Maintenance: Pap: 05-31-17 Neg History of abnormal Pap:  no MMG:  N/A Colonoscopy:  n/a BMD:   n/a  Result  n/a TDaP:  07/2018 Gardasil:   Yes, completed ZHY:QMVHQ Hep C:never Screening Labs:  Today.    reports that she has never smoked. She has never used smokeless tobacco. She reports current alcohol use of about 1.0 - 2.0 standard drinks of alcohol per week. She reports that she does not use drugs.  Past Medical History:  Diagnosis Date  . ADD (attention deficit disorder) 2013  . Asthma   . Dysmenorrhea   . Migraines    with aura  . Thrombocytosis ([place]) 2018   394,000 on 05/31/17    Past Surgical History:  Procedure Laterality Date  . OTHER SURGICAL HISTORY     hematoma surgery on head as an infant  . TONSILLECTOMY AND ADENOIDECTOMY    . WISDOM TOOTH EXTRACTION      Current Outpatient Medications  Medication Sig Dispense Refill  . atenolol (TENORMIN) 25 MG tablet     . beclomethasone (QVAR) 80 MCG/ACT inhaler Inhale 2 puffs into the lungs 2 (two) times daily. 1 Inhaler 5  . clindamycin (CLEOCIN T) 1 % external solution daily as needed.    Marland Kitchen EPINEPHrine  (AUVI-Q) 0.3 mg/0.3 mL IJ SOAJ injection Use as directed for severe allergic reaction 2 each 2  . fexofenadine (ALLEGRA) 180 MG tablet Take 180 mg by mouth daily.    Marland Kitchen ibuprofen (ADVIL,MOTRIN) 800 MG tablet Take 1 tablet (800 mg total) by mouth every 8 (eight) hours as needed. 30 tablet 2  . lansoprazole (PREVACID) 30 MG capsule 1 capsule daily for reflux. 90 capsule 3  . levalbuterol (XOPENEX HFA) 45 MCG/ACT inhaler Inhale 2 puffs into the lungs every 6 (six) hours as needed for wheezing. 2 Inhaler 1  . levalbuterol (XOPENEX) 1.25 MG/3ML nebulizer solution Take 1.25 mg by nebulization every 4 (four) hours as needed for wheezing. 270 mL 1  . levocetirizine (XYZAL) 5 MG tablet Take 1 tablet once daily as needed for runny nose. 90 tablet 3  . Levonorgestrel 19.5 MG IUD by Intrauterine route.    . methylphenidate 27 MG PO CR tablet Take by mouth.    . montelukast (SINGULAIR) 10 MG tablet Take 1 tablet once at night for coughing or wheezing. 90 tablet 3  . UBRELVY 100 MG TABS Take 1 tablet by mouth as needed.    Truett Perna 93 MCG/ACT EXHU BLOW 2 DOSES IN EACH NOSTRIL TWICE DAILY 32 mL 3  . zonisamide (ZONEGRAN) 100 MG  capsule Take by mouth.     No current facility-administered medications for this visit.     Family History  Problem Relation Age of Onset  . Diabetes Mother   . Asthma Mother   . Allergic rhinitis Mother   . Colon cancer Maternal Grandmother   . Diabetes Maternal Grandmother   . Lung cancer Maternal Grandfather   . Stroke Paternal Grandmother   . Asthma Father   . Allergic rhinitis Father   . Breast cancer Maternal Aunt   . Pancreatic cancer Paternal Uncle     Review of Systems  All other systems reviewed and are negative.   Exam:   BP 122/76   Pulse 64   Temp (!) 97.5 F (36.4 C) (Temporal)   Resp 18   Ht 5' 0.5" (1.537 m)   Wt 138 lb 9.6 oz (62.9 kg)   LMP 08/01/2019 (Exact Date)   BMI 26.62 kg/m     General appearance: alert, cooperative and appears stated  age Head: normocephalic, without obvious abnormality, atraumatic Neck: no adenopathy, supple, symmetrical, trachea midline and thyroid normal to inspection and palpation Lungs: clear to auscultation bilaterally Breasts: normal appearance, no masses or tenderness, No nipple retraction or dimpling, No nipple discharge or bleeding, No axillary adenopathy Heart: regular rate and rhythm Abdomen: soft, non-tender; no masses, no organomegaly Extremities: extremities normal, atraumatic, no cyanosis or edema Skin: skin color, texture, turgor normal. No rashes or lesions Lymph nodes: cervical, supraclavicular, and axillary nodes normal. Neurologic: grossly normal  Pelvic: External genitalia:  no lesions              No abnormal inguinal nodes palpated.              Urethra:  normal appearing urethra with no masses, tenderness or lesions              Bartholins and Skenes: normal                 Vagina: normal appearing vagina with normal color and discharge, no lesions              Cervix: no lesions              Pap taken: No. Bimanual Exam:  Uterus:  normal size, contour, position, consistency, mobility, non-tender              Adnexa: no mass, fullness, tenderness              Bm exam limited by patient's inability to relax abdominal wall.  Chaperone was present for exam.  Assessment:   Well woman visit with normal exam. Kyleena IUD.  Hx migraine HA with aura.  Dysmenorrhea. FH breast cancer in aunt age 8.   Plan: Mammogram screening discussed. Self breast awareness reviewed. Pap and HR HPV as above. Guidelines for Calcium, Vitamin D, regular exercise program including cardiovascular and weight bearing exercise. Return for pelvic US.  She understands this is transvaginal.  Routine labs.  Patient will check on her family history of breast cancer to understand if genetic testing was performed. Follow up annually and prn.   After visit summary provided.

## 2019-08-11 ENCOUNTER — Other Ambulatory Visit: Payer: Self-pay

## 2019-08-14 ENCOUNTER — Telehealth: Payer: Self-pay | Admitting: Obstetrics and Gynecology

## 2019-08-14 ENCOUNTER — Other Ambulatory Visit: Payer: Self-pay

## 2019-08-14 ENCOUNTER — Ambulatory Visit: Payer: BC Managed Care – PPO | Admitting: Obstetrics and Gynecology

## 2019-08-14 ENCOUNTER — Encounter: Payer: Self-pay | Admitting: Obstetrics and Gynecology

## 2019-08-14 VITALS — BP 122/76 | HR 64 | Temp 97.5°F | Resp 18 | Ht 60.5 in | Wt 138.6 lb

## 2019-08-14 DIAGNOSIS — N946 Dysmenorrhea, unspecified: Secondary | ICD-10-CM

## 2019-08-14 DIAGNOSIS — Z01419 Encounter for gynecological examination (general) (routine) without abnormal findings: Secondary | ICD-10-CM | POA: Diagnosis not present

## 2019-08-14 NOTE — Telephone Encounter (Signed)
Call placed to patient to review benefit and schedule recommended ultrasound. Left message requesting a return call.

## 2019-08-14 NOTE — Patient Instructions (Signed)

## 2019-08-14 NOTE — Telephone Encounter (Signed)
See previous appointment date. Patient called back, states she can coordinate with employer an earlier appointment date. Patient rescheduled ultrasound appointment to 08/17/2019 with Dr Quincy Simmonds.   cc: Dr Quincy Simmonds

## 2019-08-14 NOTE — Telephone Encounter (Signed)
Patient returned call. Reviewed benefit for recommended ultrasound. Patient acknowledges understanding of information presented. Patient is scheduled 08/29/2019 with Dr Quincy Simmonds. Patient declined earlier appointment dates, due to work schedule. Patient is aware of the appointment date, arrival time and cancellation policy. No further questions.  Forwarding to Dr Quincy Simmonds for final review. Patient is agreeable to disposition. Will close encounter.

## 2019-08-15 LAB — LIPID PANEL
Chol/HDL Ratio: 2.7 ratio (ref 0.0–4.4)
Cholesterol, Total: 146 mg/dL (ref 100–199)
HDL: 54 mg/dL (ref >39)
LDL Calculated: 74 mg/dL (ref 0–99)
Triglycerides: 91 mg/dL (ref 0–149)
VLDL Cholesterol Cal: 18 mg/dL (ref 5–40)

## 2019-08-15 LAB — CBC
Hematocrit: 43.9 % (ref 34.0–46.6)
Hemoglobin: 14.7 g/dL (ref 11.1–15.9)
MCH: 31.2 pg (ref 26.6–33.0)
MCHC: 33.5 g/dL (ref 31.5–35.7)
MCV: 93 fL (ref 79–97)
Platelets: 359 10*3/uL (ref 150–450)
RBC: 4.71 x10E6/uL (ref 3.77–5.28)
RDW: 12.8 % (ref 11.7–15.4)
WBC: 6.8 10*3/uL (ref 3.4–10.8)

## 2019-08-15 LAB — COMPREHENSIVE METABOLIC PANEL
ALT: 26 IU/L (ref 0–32)
AST: 22 IU/L (ref 0–40)
Albumin/Globulin Ratio: 1.8 (ref 1.2–2.2)
Albumin: 4.4 g/dL (ref 3.9–5.0)
Alkaline Phosphatase: 113 IU/L (ref 39–117)
BUN/Creatinine Ratio: 13 (ref 9–23)
BUN: 13 mg/dL (ref 6–20)
Bilirubin Total: 0.2 mg/dL (ref 0.0–1.2)
CO2: 21 mmol/L (ref 20–29)
Calcium: 9.2 mg/dL (ref 8.7–10.2)
Chloride: 107 mmol/L — ABNORMAL HIGH (ref 96–106)
Creatinine, Ser: 0.97 mg/dL (ref 0.57–1.00)
GFR calc Af Amer: 95 mL/min/{1.73_m2} (ref >59)
GFR calc non Af Amer: 82 mL/min/{1.73_m2} (ref >59)
Globulin, Total: 2.4 g/dL (ref 1.5–4.5)
Glucose: 77 mg/dL (ref 65–99)
Potassium: 4.3 mmol/L (ref 3.5–5.2)
Sodium: 139 mmol/L (ref 134–144)
Total Protein: 6.8 g/dL (ref 6.0–8.5)

## 2019-08-17 ENCOUNTER — Ambulatory Visit (INDEPENDENT_AMBULATORY_CARE_PROVIDER_SITE_OTHER): Payer: BC Managed Care – PPO

## 2019-08-17 ENCOUNTER — Other Ambulatory Visit: Payer: Self-pay

## 2019-08-17 ENCOUNTER — Ambulatory Visit: Payer: BC Managed Care – PPO | Admitting: Obstetrics and Gynecology

## 2019-08-17 ENCOUNTER — Encounter: Payer: Self-pay | Admitting: Obstetrics and Gynecology

## 2019-08-17 VITALS — BP 110/60 | HR 76 | Temp 97.2°F | Ht 60.75 in | Wt 140.4 lb

## 2019-08-17 DIAGNOSIS — N946 Dysmenorrhea, unspecified: Secondary | ICD-10-CM

## 2019-08-17 DIAGNOSIS — N83202 Unspecified ovarian cyst, left side: Secondary | ICD-10-CM | POA: Diagnosis not present

## 2019-08-17 NOTE — Progress Notes (Signed)
GYNECOLOGY  VISIT   HPI: 23 y.o.   Single  African American  female   [place] with Patient's last menstrual period was 08/01/2019 (exact date).   here for pelvic ultrasound.   Having increased menstrual cramping. The cramping waxes and wanes.  Motrin 800 mg helps occasionally but she does feel drowsy.  Has Kyleena.   She used Micronor in the past to treat irregular menses with Verdia Kuba.   Hx migraine with aura.   She does have chronic constipation.  She is working on this with her PCP.  She thinks her pelvic pain has nothing to do with constipation.   GYNECOLOGIC HISTORY: Patient's last menstrual period was 08/01/2019 (exact date). Contraception: Abstinence/Kyleena IUD 06-04-17 Menopausal hormone therapy:  n/a Last mammogram:  n/a Last pap smear:  05-31-17 Neg        OB History    Gravida  0   Para  0   Term  0   Preterm  0   AB  0   Living  0     SAB  0   TAB  0   Ectopic  0   Multiple  0   Live Births  0              Patient Active Problem List   Diagnosis Date Noted  . Moderate persistent asthma without complication 16/38/4536  . Gastroesophageal reflux disease without esophagitis 05/11/2016  . Allergic rhinitis 12/18/2015  . Mild persistent asthma 12/18/2015  . GERD (gastroesophageal reflux disease) 12/18/2015  . 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 ADD (attention deficit disorder) 2013  . Asthma   . Dysmenorrhea   . Migraines    with aura  . Thrombocytosis ([place]) 2018   [phone] on 05/31/17    Past Surgical History:  Procedure Laterality Date  . OTHER SURGICAL HISTORY     hematoma surgery on head as an infant  . TONSILLECTOMY AND ADENOIDECTOMY    . WISDOM TOOTH EXTRACTION      Current Outpatient Medications  Medication Sig Dispense Refill  . atenolol (TENORMIN) 25 MG tablet     . beclomethasone (QVAR) 80 MCG/ACT inhaler Inhale 2 puffs into the lungs 2 (two) times  daily. 1 Inhaler 5  . clindamycin (CLEOCIN T) 1 % external solution daily as needed.    Marland Kitchen EPINEPHrine (AUVI-Q) 0.3 mg/0.3 mL IJ SOAJ injection Use as directed for severe allergic reaction 2 each 2  . fexofenadine (ALLEGRA) 180 MG tablet Take 180 mg by mouth daily.    Marland Kitchen ibuprofen (ADVIL,MOTRIN) 800 MG tablet Take 1 tablet (800 mg total) by mouth every 8 (eight) hours as needed. 30 tablet 2  . lansoprazole (PREVACID) 30 MG capsule 1 capsule daily for reflux. 90 capsule 3  . levalbuterol (XOPENEX HFA) 45 MCG/ACT inhaler Inhale 2 puffs into the lungs every 6 (six) hours as needed for wheezing. 2 Inhaler 1  . levalbuterol (XOPENEX) 1.25 MG/3ML nebulizer solution Take 1.25 mg by nebulization every 4 (four) hours as needed for wheezing. 270 mL 1  . levocetirizine (XYZAL) 5 MG tablet Take 1 tablet once daily as needed for runny nose. 90 tablet 3  . Levonorgestrel 19.5 MG IUD by Intrauterine route.    . methylphenidate 27 MG PO CR tablet Take by mouth.    . montelukast (SINGULAIR) 10 MG tablet Take 1 tablet once at night for coughing or wheezing. 90 tablet 3  . UBRELVY  100 MG TABS Take 1 tablet by mouth as needed.    Truett Perna 93 MCG/ACT EXHU BLOW 2 DOSES IN EACH NOSTRIL TWICE DAILY 32 mL 3  . zonisamide (ZONEGRAN) 100 MG capsule Take by mouth.     No current facility-administered medications for this visit.      ALLERGIES: Amoxicillin-pot clavulanate, Azithromycin, Doxycycline, Peanut oil, and Peanut-containing drug products  Family History  Problem Relation Age of Onset  . Diabetes Mother   . Asthma Mother   . Allergic rhinitis Mother   . Colon cancer Maternal Grandmother   . Diabetes Maternal Grandmother   . Lung cancer Maternal Grandfather   . Stroke Paternal Grandmother   . Asthma Father   . Allergic rhinitis Father   . Breast cancer Maternal Aunt   . Pancreatic cancer Paternal Uncle     Social History   Socioeconomic History  . Marital status: Single    Spouse name: Not on file   . Number of children: Not on file  . Years of education: Not on file  . Highest education level: Not on file  Occupational History  . Not on file  Social Needs  . Financial resource strain: Not on file  . Food insecurity    Worry: Not on file    Inability: Not on file  . Transportation needs    Medical: Not on file    Non-medical: Not on file  Tobacco Use  . Smoking status: Never Smoker  . Smokeless tobacco: Never Used  Substance and Sexual Activity  . Alcohol use: Yes    Alcohol/week: 1.0 - 2.0 standard drinks    Types: 1 - 2 Glasses of wine per week  . Drug use: No  . Sexual activity: Never    Partners: Male    Birth control/protection: Abstinence    Comment: never sexually active  Lifestyle  . Physical activity    Days per week: Not on file    Minutes per session: Not on file  . Stress: Not on file  Relationships  . Social Herbalist on phone: Not on file    Gets together: Not on file    Attends religious service: Not on file    Active member of club or organization: Not on file    Attends meetings of clubs or organizations: Not on file    Relationship status: Not on file  . Intimate partner violence    Fear of current or ex partner: Not on file    Emotionally abused: Not on file    Physically abused: Not on file    Forced sexual activity: Not on file  Other Topics Concern  . Not on file  Social History Narrative  . Not on file    Review of Systems  All other systems reviewed and are negative.   PHYSICAL EXAMINATION:    BP 110/60   Pulse 76   Temp (!) 97.2 F (36.2 C) (Temporal)   Ht 5' 0.75" (1.543 m)   Wt 140 lb 6.4 oz (63.7 kg)   LMP 08/01/2019 (Exact Date)   BMI 26.75 kg/m     General appearance: alert, cooperative and appears stated age  Pelvic US Uterus no masses.  IUD in endometrial canal. EMS 4.02 mm.  Left ovarian hemorrhagic cyst - 31 mm.  Normal right ovary.  No free fluid.  ASSESSMENT  Dysmenorrhea.  Hemorrhagic  left ovarian cyst.  Kyleena IUD.  Hx migraine with aura.   PLAN  We  discussed reasons for dysmenorrhea and pelvic pain - endometriosis, adhesive disease, infection, the IUD itself, other organs such as bowel or urinary system.  She feels reassured regarding the ultrasound findings today of a normal position of the IUD.  She may try Aleve 2 po q 12 hours prn instead of Motrin.  We discussed alternatives to her current care - short course of Micronor, Depo Provera.  She will monitor for now.  Fu prn.    An After Visit Summary was printed and given to the patient.  __15____ minutes face to face time of which over 50% was spent in counseling.

## 2019-08-29 ENCOUNTER — Other Ambulatory Visit: Payer: BC Managed Care – PPO | Admitting: Obstetrics and Gynecology

## 2019-08-29 ENCOUNTER — Other Ambulatory Visit: Payer: BC Managed Care – PPO

## 2019-12-04 ENCOUNTER — Other Ambulatory Visit: Payer: Self-pay

## 2019-12-04 MED ORDER — LEVOCETIRIZINE DIHYDROCHLORIDE 5 MG PO TABS: ORAL_TABLET | ORAL | 0 refills | Status: DC

## 2019-12-04 NOTE — Telephone Encounter (Signed)
Patient would like to be put back on Xyzal. She would like the xyzal sent into [facility]   Please Advise

## 2019-12-04 NOTE — Telephone Encounter (Signed)
Xyzal #90 with no refills sent to [facility]. Patient made aware.

## 2020-01-04 ENCOUNTER — Ambulatory Visit: Payer: BC Managed Care – PPO | Admitting: Allergy & Immunology

## 2020-01-04 ENCOUNTER — Other Ambulatory Visit: Payer: Self-pay

## 2020-01-04 ENCOUNTER — Encounter: Payer: Self-pay | Admitting: Allergy & Immunology

## 2020-01-04 VITALS — BP 122/92 | HR 78 | Temp 98.0°F | Resp 16 | Ht 60.0 in | Wt 142.6 lb

## 2020-01-04 DIAGNOSIS — T7800XD Anaphylactic reaction due to unspecified food, subsequent encounter: Secondary | ICD-10-CM

## 2020-01-04 DIAGNOSIS — J302 Other seasonal allergic rhinitis: Secondary | ICD-10-CM

## 2020-01-04 DIAGNOSIS — J4530 Mild persistent asthma, uncomplicated: Secondary | ICD-10-CM

## 2020-01-04 DIAGNOSIS — J3089 Other allergic rhinitis: Secondary | ICD-10-CM | POA: Diagnosis not present

## 2020-01-04 MED ORDER — OMEPRAZOLE 20 MG PO CPDR: 20.0000 mg | DELAYED_RELEASE_CAPSULE | Freq: Every day | ORAL | 5 refills | Status: DC

## 2020-01-04 MED ORDER — AZELASTINE HCL 0.1 % NA SOLN: 2.0000 | Freq: Every day | NASAL | 5 refills | Status: DC

## 2020-01-04 NOTE — Progress Notes (Signed)
FOLLOW UP  Date of Service/Encounter:  01/04/20   Assessment:   Mild persistent asthma, uncomplicated  Anaphylactic shock due to food(peanuts)  Allergic rhinitis- s/p 5+ years of immunotherapy when she was in middle/high school   Plan/Recommendations:   1. Mild persistent asthma, uncomplicated - Lung testing deferred today.  - We will not make any medication changes.  - Daily controller medication(s): Singulair 10mg  daily  - Prior to physical activity: Xopenex 2 puffs 10-15 minutes before physical activity. - Rescue medications: Xopenex 4 puffs every 4-6 hours as needed - Changes during respiratory infections or worsening symptoms: Increase Qvar 5mcg to 4 puffs twice daily for TWO WEEKS. - Asthma control goals:  * Full participation in all desired activities (may need albuterol before activity) * Albuterol use two time or less a week on average (not counting use with activity) * Cough interfering with sleep two time or less a month * Oral steroids no more than once a year * No hospitalizations  2. Anaphylactic shock due to food (peanuts) - Continue to avoid peanuts.  - We will send in a prescription for AuviQ (epinephrine).  3. Allergic rhinitis - You are free to use any antihistamines you want.  - Some people develop tolerance to certain antihistamines.  - Continue with Singulair 10mg  daily. - Continue with Xhance two sprays per nostril twice daily. - Add on Astelin one spray per nostril at night (this is a nasal antihistamine).  - We are going to get an environmental allergy panel in case we decide to re-start allergy shots.   4. Return in about 6 months (around 07/03/2020). This can be an in-person, a virtual Webex or a telephone follow up visit.   Subjective:   Stephanie Simpson is a 24 y.o. female presenting today for follow up of  Chief Complaint  Patient presents with  . Asthma    no issues    Stephanie Simpson has a history of the following: Patient Active  Problem List   Diagnosis Date Noted  . Lichen planus linearis 11/03/2018  . Granuloma annulare 07/01/2017  . Moderate persistent asthma without complication [id number]  . Gastroesophageal reflux disease without esophagitis 05/11/2016  . Allergic rhinitis 12/18/2015  . Mild persistent asthma 12/18/2015  . GERD (gastroesophageal reflux disease) 12/18/2015  . Anaphylactic shock due to adverse food reaction 12/18/2015  . Seborrheic dermatitis, unspecified 07/11/2015  . Folliculitis [id number]  . Migraine without aura 07/07/2012  . Acne 12/16/2011  . Keratosis pilaris 12/16/2011    History obtained from: chart review and patient.  Stephanie Simpson is a 24 y.o. female presenting for a follow up visit.  She was last seen in July 2020.  At that time, her lung testing looked excellent.  We continued her on Singulair 10 mg daily with Qvar added during respiratory flares.  She has a history of anaphylaxis to peanuts.  We recommended continued avoidance.  We did refill her Auvi-Q and talked about oral immunotherapy.  For her allergic rhinitis, we continued with Allegra in the morning, Xyzal at night, Singulair, and XHANCE 2 sprays per nostril twice daily.  Since the last visit, she has done well.  She continues to work as a Engineer, site.  She is planning to apply to vet school in the fall.  Asthma/Respiratory Symptom History: She is doing well from an asthma perspective. She Singulair daily. She is not needing her rescue inhaler often at all.  Stephanie Simpson's asthma has been well controlled. She has not required rescue medication,  experienced nocturnal awakenings due to lower respiratory symptoms, nor have activities of daily living been limited. She has required no Emergency Department or Urgent Care visits for her asthma. She has required zero courses of systemic steroids for asthma exacerbations since the last visit. ACT score today is 25, indicating excellent asthma symptom control.   Allergic Rhinitis Symptom  History: She tells me that her allergies are acting up. At the last visit, we recommended that she is using [place] and [place] together. However, the is not working well. She finished her allergy shots in 2014. She has not needed antibiotics at all since the last visit. She is open to retesting to see if she has developed new or worsening environmental allergens which might be contributing to her symptoms.   Food Allergy Symptom History: She continues to avoid peanuts. She has had no accidental exposures at all. Stephanie Simpson is up to date.  Otherwise, there have been no changes to her past medical history, surgical history, family history, or social history.    Review of Systems  Constitutional: Negative.  Negative for chills, fever, malaise/fatigue and weight loss.  HENT: Positive for congestion and sinus pain. Negative for ear discharge, ear pain and sore throat.        Positive for postnasal drip.  Eyes: Negative for pain, discharge and redness.  Respiratory: Negative for cough, sputum production, shortness of breath and wheezing.   Cardiovascular: Negative.  Negative for chest pain and palpitations.  Gastrointestinal: Negative for abdominal pain, constipation, diarrhea, heartburn, nausea and vomiting.  Skin: Negative.  Negative for itching and rash.  Neurological: Negative for dizziness and headaches.  Endo/Heme/Allergies: Negative for environmental allergies. Does not bruise/bleed easily.       Objective:   Blood pressure (!) 122/92, pulse 78, temperature 98 F (36.7 C), temperature source Temporal, resp. rate 16, height 5' (1.524 m), weight 142 lb 9.6 oz (64.7 kg), SpO2 99 %. Body mass index is 27.85 kg/m.   Physical Exam:  Physical Exam  Constitutional: She appears well-developed.  Very pleasant female. Cooperative with the exam.   HENT:  Head: Normocephalic and atraumatic.  Right Ear: Tympanic membrane, external ear and ear canal normal.  Left Ear: Tympanic membrane, external  ear and ear canal normal.  Nose: Mucosal edema and rhinorrhea present. No nasal deformity or septal deviation. No epistaxis. Right sinus exhibits no maxillary sinus tenderness and no frontal sinus tenderness. Left sinus exhibits no maxillary sinus tenderness and no frontal sinus tenderness.  Mouth/Throat: Uvula is midline and oropharynx is clear and moist. Mucous membranes are not pale and not dry.  There is cobblestoning present in the posterior oropharynx.   Eyes: Pupils are equal, round, and reactive to light. Conjunctivae and EOM are normal. Right eye exhibits no chemosis and no discharge. Left eye exhibits no chemosis and no discharge. Right conjunctiva is not injected. Left conjunctiva is not injected.  Cardiovascular: Normal rate, regular rhythm and normal heart sounds.  Respiratory: Effort normal and breath sounds normal. No accessory muscle usage. No tachypnea. No respiratory distress. She has no wheezes. She has no rhonchi. She has no rales. She exhibits no tenderness.  Moving air well in all lung fields. No wheezing or crackles noted.   Lymphadenopathy:    She has no cervical adenopathy.  Neurological: She is alert.  Skin: No abrasion, no petechiae and no rash noted. Rash is not papular, not vesicular and not urticarial. No erythema. No pallor.  Psychiatric: She has a normal mood and affect.  Diagnostic studies: none      Salvatore Marvel, MD  [facility]

## 2020-01-04 NOTE — Patient Instructions (Addendum)
1. Mild persistent asthma, uncomplicated - Lung testing deferred today.  - We will not make any medication changes.  - Daily controller medication(s): Singulair 10mg  daily  - Prior to physical activity: Xopenex 2 puffs 10-15 minutes before physical activity. - Rescue medications: Xopenex 4 puffs every 4-6 hours as needed - Changes during respiratory infections or worsening symptoms: Increase Qvar 62mcg to 4 puffs twice daily for TWO WEEKS. - Asthma control goals:  * Full participation in all desired activities (may need albuterol before activity) * Albuterol use two time or less a week on average (not counting use with activity) * Cough interfering with sleep two time or less a month * Oral steroids no more than once a year * No hospitalizations  2. Anaphylactic shock due to food (peanuts) - Continue to avoid peanuts.  - We will send in a prescription for AuviQ (epinephrine).  3. Allergic rhinitis - You are free to use any antihistamines you want.  - Some people develop tolerance to certain antihistamines.  - Continue with Singulair 10mg  daily. - Continue with Xhance two sprays per nostril twice daily. - Add on Astelin one spray per nostril at night (this is a nasal antihistamine).  - We are going to get an environmental allergy panel in case we decide to re-start allergy shots.   4. Return in about 6 months (around 07/03/2020). This can be an in-person, a virtual Webex or a telephone follow up visit.   Please inform us of any Emergency Department visits, hospitalizations, or changes in symptoms. Call us before going to the ED for breathing or allergy symptoms since we might be able to fit you in for a sick visit. Feel free to contact us anytime with any questions, problems, or concerns.  It was a pleasure to see you again today!  Websites that have reliable patient information: 1. American Academy of Asthma, Allergy, and Immunology: www.aaaai.org 2. Food Allergy Research and  Education (FARE): foodallergy.org 3. Mothers of Asthmatics: http://www.asthmacommunitynetwork.org 4. American College of Allergy, Asthma, and Immunology: www.acaai.org  "Like" [place] on Facebook and Instagram for our latest updates!        Make sure you are registered to vote! If you have moved or changed any of your contact information, you will need to get this updated before voting!  In some cases, you MAY be able to register to vote online: [email or web address]

## 2020-01-06 ENCOUNTER — Encounter: Payer: Self-pay | Admitting: Allergy & Immunology

## 2020-01-06 LAB — IGE+ALLERGENS ZONE 2(30)
Alternaria Alternata IgE: <0.1 kU/L
Amer Sycamore IgE Qn: <0.1 kU/L
Aspergillus Fumigatus IgE: <0.1 kU/L
Bahia Grass IgE: <0.1 kU/L
Bermuda Grass IgE: <0.1 kU/L
Cat Dander IgE: <0.1 kU/L
Cedar, Mountain IgE: <0.1 kU/L
Cladosporium Herbarum IgE: <0.1 kU/L
Cockroach, American IgE: <0.1 kU/L
Common Silver Birch IgE: 3.07 kU/L — AB
D Farinae IgE: 1.25 kU/L — AB
D Pteronyssinus IgE: 1.25 kU/L — AB
Dog Dander IgE: 1.67 kU/L — AB
Elm, American IgE: <0.1 kU/L
Hickory, White IgE: 0.37 kU/L — AB
IgE (Immunoglobulin E), Serum: 95 IU/mL (ref 6–495)
Johnson Grass IgE: <0.1 kU/L
Maple/Box Elder IgE: 0.3 kU/L — AB
Mucor Racemosus IgE: <0.1 kU/L
Mugwort IgE Qn: <0.1 kU/L
Nettle IgE: <0.1 kU/L
Oak, White IgE: 5.06 kU/L — AB
Penicillium Chrysogen IgE: <0.1 kU/L
Pigweed, Rough IgE: <0.1 kU/L
Plantain, English IgE: <0.1 kU/L
Ragweed, Short IgE: <0.1 kU/L
Sheep Sorrel IgE Qn: 0.13 kU/L — AB
Stemphylium Herbarum IgE: <0.1 kU/L
Sweet gum IgE RAST Ql: 1.56 kU/L — AB
Timothy Grass IgE: 0.13 kU/L — AB
White Mulberry IgE: <0.1 kU/L

## 2020-02-15 ENCOUNTER — Ambulatory Visit: Payer: BC Managed Care – PPO | Admitting: Podiatry

## 2020-03-08 ENCOUNTER — Ambulatory Visit: Payer: BC Managed Care – PPO | Attending: Internal Medicine

## 2020-03-08 DIAGNOSIS — Z23 Encounter for immunization: Secondary | ICD-10-CM

## 2020-03-08 NOTE — Progress Notes (Signed)
   Covid-19 Vaccination Clinic  Name:  Stephanie Simpson    MRN: [id number] DOB: September 11, 1996  03/08/2020  [name] was observed post Covid-19 immunization for 30 min without incident. She was provided with Vaccine Information Sheet and instruction to access the V-Safe system.   [name] was instructed to call 911 with any severe reactions post vaccine: Marland Kitchen Difficulty breathing  . Swelling of face and throat  . A fast heartbeat  . A bad rash all over body  . Dizziness and weakness   Immunizations Administered    Name Date Dose VIS Date Route   Pfizer COVID-19 Vaccine 03/08/2020  4:25 PM 0.3 mL 12/08/2019 Intramuscular   Manufacturer: [place]   Lot: [id number]   [place]: [id number]

## 2020-04-01 ENCOUNTER — Ambulatory Visit: Payer: BC Managed Care – PPO | Attending: Internal Medicine

## 2020-04-01 ENCOUNTER — Ambulatory Visit: Payer: BC Managed Care – PPO

## 2020-04-01 DIAGNOSIS — Z23 Encounter for immunization: Secondary | ICD-10-CM

## 2020-04-01 NOTE — Progress Notes (Signed)
   Covid-19 Vaccination Clinic  Name:  Stephanie Simpson    MRN: [id number] DOB: 12/12/1996  04/01/2020  Stephanie Simpson was observed post Covid-19 immunization for 15 minutes without incident. She was provided with Vaccine Information Sheet and instruction to access the V-Safe system.   Stephanie Simpson was instructed to call 911 with any severe reactions post vaccine: Marland Kitchen Difficulty breathing  . Swelling of face and throat  . A fast heartbeat  . A bad rash all over body  . Dizziness and weakness   Immunizations Administered    Name Date Dose VIS Date Route   Pfizer COVID-19 Vaccine 04/01/2020 12:59 PM 0.3 mL 12/08/2019 Intramuscular   Manufacturer: [place]   Lot: [id number]   [place]: [id number]

## 2020-07-03 ENCOUNTER — Other Ambulatory Visit: Payer: Self-pay | Admitting: Allergy & Immunology

## 2020-07-03 ENCOUNTER — Telehealth: Payer: Self-pay | Admitting: Allergy & Immunology

## 2020-07-03 MED ORDER — XHANCE 93 MCG/ACT NA EXHU: 2.0000 | INHALANT_SUSPENSION | Freq: Two times a day (BID) | NASAL | 5 refills | Status: DC

## 2020-07-03 NOTE — Telephone Encounter (Signed)
Patient mom called for her about her Stephanie Simpson had run out and needs a refill sent into the mail order pharmacy. [phone].

## 2020-07-03 NOTE — Telephone Encounter (Signed)
Refill has been sent. There is a possibility that [facility] will not cover the [facility] because the patient does not have a history of nasal polyposis.

## 2020-07-04 ENCOUNTER — Ambulatory Visit: Payer: BC Managed Care – PPO | Admitting: Allergy & Immunology

## 2020-07-16 ENCOUNTER — Ambulatory Visit: Payer: BC Managed Care – PPO | Admitting: Allergy & Immunology

## 2020-07-18 ENCOUNTER — Encounter: Payer: Self-pay | Admitting: Allergy & Immunology

## 2020-07-18 ENCOUNTER — Other Ambulatory Visit: Payer: Self-pay

## 2020-07-18 ENCOUNTER — Ambulatory Visit: Payer: BC Managed Care – PPO | Admitting: Allergy & Immunology

## 2020-07-18 VITALS — BP 110/62 | HR 89 | Temp 98.7°F | Resp 16

## 2020-07-18 DIAGNOSIS — J3089 Other allergic rhinitis: Secondary | ICD-10-CM

## 2020-07-18 DIAGNOSIS — T7800XD Anaphylactic reaction due to unspecified food, subsequent encounter: Secondary | ICD-10-CM | POA: Diagnosis not present

## 2020-07-18 DIAGNOSIS — J4530 Mild persistent asthma, uncomplicated: Secondary | ICD-10-CM

## 2020-07-18 DIAGNOSIS — J302 Other seasonal allergic rhinitis: Secondary | ICD-10-CM | POA: Diagnosis not present

## 2020-07-18 MED ORDER — OMEPRAZOLE 20 MG PO CPDR: 20.0000 mg | DELAYED_RELEASE_CAPSULE | Freq: Every day | ORAL | 5 refills | Status: DC

## 2020-07-18 MED ORDER — MONTELUKAST SODIUM 10 MG PO TABS: ORAL_TABLET | ORAL | 3 refills | Status: DC

## 2020-07-18 MED ORDER — EPINEPHRINE 0.3 MG/0.3ML IJ SOAJ: INTRAMUSCULAR | 2 refills | Status: DC

## 2020-07-18 MED ORDER — LEVALBUTEROL TARTRATE 45 MCG/ACT IN AERO: 2.0000 | INHALATION_SPRAY | Freq: Four times a day (QID) | RESPIRATORY_TRACT | 1 refills | Status: DC | PRN

## 2020-07-18 NOTE — Patient Instructions (Addendum)
1. Mild persistent asthma, uncomplicated - Lung testing looks great today.  - We will not make any medication changes.  - Daily controller medication(s): Singulair 10mg  daily  - Prior to physical activity: Xopenex 2 puffs 10-15 minutes before physical activity. - Rescue medications: Xopenex 4 puffs every 4-6 hours as needed - Changes during respiratory infections or worsening symptoms: Add on Qvar 82mcg to 4 puffs twice daily for TWO WEEKS. - Asthma control goals:  * Full participation in all desired activities (may need albuterol before activity) * Albuterol use two time or less a week on average (not counting use with activity) * Cough interfering with sleep two time or less a month * Oral steroids no more than once a year * No hospitalizations  2. Anaphylactic shock due to food (peanuts) - Continue to avoid peanuts.  - We will refill in a prescription for AuviQ (epinephrine).  3. Allergic rhinitis - Continue with Xyzal and Allegra.  - Add on Ryvent 6mg  every 8 hours as needed for breakthrough symptoms (this can cause sleepiness). - Samples provided. - We will send this into a mail order pharmacy.  - Continue with Singulair 10mg  daily. - Continue with Xhance two sprays per nostril twice daily. - Continue with Astelin one spray per nostril at night (this is a nasal antihistamine).  - Consider restarting allergy shots for long term control.  - Copy of test results provided.   4. Return in about 6 months (around 01/18/2021). This can be an in-person, a virtual Webex or a telephone follow up visit.   Please inform us of any Emergency Department visits, hospitalizations, or changes in symptoms. Call us before going to the ED for breathing or allergy symptoms since we might be able to fit you in for a sick visit. Feel free to contact us anytime with any questions, problems, or concerns.  It was a pleasure to see you again today!  Websites that have reliable patient information: 1.  American Academy of Asthma, Allergy, and Immunology: www.aaaai.org 2. Food Allergy Research and Education (FARE): foodallergy.org 3. Mothers of Asthmatics: http://www.asthmacommunitynetwork.org 4. American College of Allergy, Asthma, and Immunology: www.acaai.org   COVID-19 Vaccine Information can be found at: ShippingScam.co.uk For questions related to vaccine distribution or appointments, please email [email or web address] [email or web address] or call [phone].     Like [place] on [place] and Instagram for our latest updates!        Make sure you are registered to vote! If you have moved or changed any of your contact information, you will need to get this updated before voting!  In some cases, you MAY be able to register to vote online: [email or web address]

## 2020-07-18 NOTE — Progress Notes (Signed)
FOLLOW UP  Date of Service/Encounter:  07/18/20   Assessment:   Mild persistent asthma, uncomplicated  Anaphylactic shock due to food(peanuts)  Allergic rhinitis (grasses, weeds, trees, dust mites, dog)- s/p 5+ years of immunotherapy when she was in middle/high school  Plan/Recommendations:   1. Mild persistent asthma, uncomplicated - Lung testing looks great today.  - We will not make any medication changes.  - Daily controller medication(s): Singulair 10mg  daily  - Prior to physical activity: Xopenex 2 puffs 10-15 minutes before physical activity. - Rescue medications: Xopenex 4 puffs every 4-6 hours as needed - Changes during respiratory infections or worsening symptoms: Add on Qvar 82mcg to 4 puffs twice daily for TWO WEEKS. - Asthma control goals:  * Full participation in all desired activities (may need albuterol before activity) * Albuterol use two time or less a week on average (not counting use with activity) * Cough interfering with sleep two time or less a month * Oral steroids no more than once a year * No hospitalizations  2. Anaphylactic shock due to food (peanuts) - Continue to avoid peanuts.  - We will refill in a prescription for AuviQ (epinephrine).  3. Allergic rhinitis - Continue with Xyzal and Allegra.  - Add on Ryvent 6mg  every 8 hours as needed for breakthrough symptoms (this can cause sleepiness). - Samples provided. - We will send this into a mail order pharmacy.  - Continue with Singulair 10mg  daily. - Continue with Xhance two sprays per nostril twice daily. - Continue with Astelin one spray per nostril at night (this is a nasal antihistamine).  - Consider restarting allergy shots for long term control.  - Copy of test results provided.   4. Return in about 6 months (around 01/18/2021). This can be an in-person, a virtual Webex or a telephone follow up visit.  Subjective:   Stephanie Simpson is a 24 y.o. female presenting today for follow up  of  Chief Complaint  Patient presents with  . Asthma  . Allergic Rhinitis     congestion usually in the morning     Stephanie Simpson has a history of the following: Patient Active Problem List   Diagnosis Date Noted  . Lichen planus linearis 11/03/2018  . Granuloma annulare 07/01/2017  . Moderate persistent asthma without complication 26/20/3559  . Gastroesophageal reflux disease without esophagitis 05/11/2016  . Allergic rhinitis 12/18/2015  . Mild persistent asthma 12/18/2015  . GERD (gastroesophageal reflux disease) 12/18/2015  . Anaphylactic shock due to adverse food reaction 12/18/2015  . Seborrheic dermatitis, unspecified 07/11/2015  . Folliculitis 74/16/3845  . Migraine without aura 07/07/2012  . Acne 12/16/2011  . Keratosis pilaris 12/16/2011    History obtained from: chart review and patient.  Stephanie Simpson is a 24 y.o. female presenting for a follow up visit. She was last seen in January 2021. At that time, we deferred lung testing. We continued with Singulair as well as Xopenex as needed.  She does have Qvar that she has during respiratory flares.  I recommended continued peanut avoidance and we sent in a prescription for Auvi-Q.  For her allergies, we will continue with Singulair as well as XHANCE 2 sprays per nostril twice daily.  We did add on Astelin as needed.  Since last visit, she has mostly done well.  Asthma/Respiratory Symptom History: She remains on the Singulair 10 mg daily.  She has not been using her Xopenex much at all.  She will occasionally need it when she is at  work.  She has not needed to add the Qvar and has not been to the emergency room.  Allergic Rhinitis Symptom History: She remains on both Xyzal and Allegra daily.  She is also on the Singulair as well as XHANCE and Astelin.  She is never excited about discussing allergy shots, as she already continued 5 years of them when she was younger.  She does endorse quite a bit of postnasal drip at work.  Food  Allergy Symptom History: She has had no accidental ingestions of peanuts.  She does need a new epinephrine autoinjector.   She continues to work at [employer] office.  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  She is fully immunized for COVID-19.  Otherwise, there have been no changes to her past medical history, surgical history, family history, or social history.    Review of Systems  Constitutional: Negative.  Negative for chills, fever, malaise/fatigue and weight loss.  HENT: Positive for congestion. Negative for ear discharge, ear pain and sinus pain.   Eyes: Negative for pain, discharge and redness.  Respiratory: Positive for cough. Negative for sputum production, shortness of breath and wheezing.   Cardiovascular: Negative.  Negative for chest pain and palpitations.  Gastrointestinal: Negative for abdominal pain, constipation, diarrhea, heartburn, nausea and vomiting.  Skin: Negative.  Negative for itching and rash.  Neurological: Negative for dizziness and headaches.  Endo/Heme/Allergies: Positive for environmental allergies. Does not bruise/bleed easily.          Objective:   Blood pressure (!) 110/62, pulse 89, temperature 98.7 F (37.1 C), temperature source Temporal, resp. rate 16, SpO2 98 %. There is no height or weight on file to calculate BMI.   Physical Exam:  Physical Exam Constitutional:      Appearance: She is well-developed.     Comments: Talkative female. Cooperative with the exam.   HENT:     Head: Normocephalic and atraumatic.     Right Ear: Tympanic membrane, ear canal and external ear normal.     Left Ear: Tympanic membrane and ear canal normal.     Nose: No nasal deformity, septal deviation, mucosal edema or rhinorrhea.     Right Sinus: No maxillary sinus tenderness or frontal sinus tenderness.     Left Sinus: No maxillary sinus tenderness or frontal sinus tenderness.     Mouth/Throat:     Mouth: Mucous membranes are not pale and  not dry.     Pharynx: Uvula midline.  Eyes:     General:        Right eye: No discharge.        Left eye: No discharge.     Conjunctiva/sclera: Conjunctivae normal.     Right eye: Right conjunctiva is not injected. No chemosis.    Left eye: Left conjunctiva is not injected. No chemosis.    Pupils: Pupils are equal, round, and reactive to light.  Cardiovascular:     Rate and Rhythm: Normal rate and regular rhythm.     Heart sounds: Normal heart sounds.  Pulmonary:     Effort: Pulmonary effort is normal. No tachypnea, accessory muscle usage or respiratory distress.     Breath sounds: Normal breath sounds. No wheezing, rhonchi or rales.  Chest:     Chest wall: No tenderness.  Lymphadenopathy:     Cervical: No cervical adenopathy.  Skin:    Coloration: Skin is not pale.     Findings: No abrasion, erythema, petechiae or rash. Rash is not  papular, urticarial or vesicular.  Neurological:     Mental Status: She is alert.      Diagnostic studies:    Spirometry: results normal (FEV1: 2.43/98%, FVC: 3.04/108%, FEV1/FVC: 80%).    Spirometry consistent with normal pattern.   Allergy Studies: none       Salvatore Marvel, MD  [facility]

## 2020-07-19 ENCOUNTER — Encounter: Payer: Self-pay | Admitting: Allergy & Immunology

## 2020-08-19 ENCOUNTER — Encounter: Payer: Self-pay | Admitting: Podiatry

## 2020-08-19 ENCOUNTER — Other Ambulatory Visit: Payer: Self-pay

## 2020-08-19 ENCOUNTER — Ambulatory Visit: Payer: BC Managed Care – PPO | Admitting: Podiatry

## 2020-08-19 VITALS — Temp 97.5°F

## 2020-08-19 DIAGNOSIS — L600 Ingrowing nail: Secondary | ICD-10-CM | POA: Diagnosis not present

## 2020-08-19 MED ORDER — NEOMYCIN-POLYMYXIN-HC 3.5-10000-1 OT SOLN: OTIC | 0 refills | Status: DC

## 2020-08-19 NOTE — Patient Instructions (Signed)

## 2020-08-19 NOTE — Progress Notes (Signed)
Subjective:   Patient ID: Stephanie Simpson, female   DOB: 24 y.o.   MRN: [id number]   HPI Patient presents stating she has chronic ingrown toenails of both big toes and states they have been sore and making it hard to wear shoe gear.  Had her other borders removed at one time in the past and these been bothering her for at least 6 months and she is tried to soak them trim them without relief.  Patient does not smoke likes to be active   Review of Systems  All other systems reviewed and are negative.       Objective:  Physical Exam Vitals and nursing note reviewed.  Constitutional:      Appearance: She is well-developed.  Pulmonary:     Effort: Pulmonary effort is normal.  Musculoskeletal:        General: Normal range of motion.  Skin:    General: Skin is warm.  Neurological:     Mental Status: She is alert.     Neurovascular status intact muscle strength adequate range of motion within normal limits.  Patient is found to have incurvated hallux nails bilateral medial borders that are very painful when pressed making shoe gear difficult with no active drainage or redness associated with them     Assessment:  Chronic ingrown toenail deformity hallux bilateral with pain     Plan:  H&P reviewed condition recommended removal of the nail borders.  I explained procedure risk and patient wants surgery and at this point I allowed her to sign consent form after explaining risk.  Today I infiltrated each hallux 60 mg like Marcaine mixture sterile prep applied and using sterile instrumentation I removed the medial border bilateral exposed matrix and applied phenol 3 applications 30 seconds followed by alcohol lavage sterile dressing.  Gave instructions on soaks and reappoint and leave dressings on 24 hours but take off earlier if throbbing were to occur wrote prescription for drops and encouraged her to call with questions concerns

## 2020-10-01 NOTE — Progress Notes (Signed)
24 y.o. G0P0000 Single African American female here for annual exam.    Patient states cycles are heavy and lasting up to 10 days.  Wants to know if her Verdia Kuba is in place.  Menses heavier and lasting longer.  Has some severe cramping with them.  Her last cycle was better. She is not sure if her Verdia Kuba is helping.  Likes Motrin for her cramping.   Taking spironolactone to treat acne.   Not sexually active in the last year.  Declines  STD testing.   Vaccinated against Covid.   PCP: Marda Stalker, PA-C    Patient's last menstrual period was 09/22/2020 (exact date).     Period Cycle (Days): 30 Period Duration (Days): 10 Period Pattern: Regular Menstrual Flow: Moderate (each cycle different) Menstrual Control: Tampon, Maxi pad Menstrual Control Change Freq (Hours): changes every 2-3 hours on heaviest day Dysmenorrhea: (!) Severe Dysmenorrhea Symptoms: Cramping, Nausea, Diarrhea, Headache     Sexually active: No.  The current method of family planning is Abstinence & IUD--Kyleena 06-04-17.    Exercising: Yes.    yoga Smoker:  no  Health Maintenance: Pap: 05-31-17 Neg History of abnormal Pap:  no MMG:  n/a Colonoscopy:  n/a BMD:   n/a  Result  n/a TDaP:  07/2018 Gardasil:   Yes, completed HIV: never Hep C: never Screening Labs: PCP Flu vaccine:  Completed.    reports that she has never smoked. She has never used smokeless tobacco. She reports current alcohol use of about 1.0 - 2.0 standard drink of alcohol per week. She reports that she does not use drugs.  Past Medical History:  Diagnosis Date  . ADD (attention deficit disorder) 2013  . Asthma   . Dysmenorrhea   . Migraines    with aura  . Thrombocytosis 2018   [phone] on 05/31/17    Past Surgical History:  Procedure Laterality Date  . ADENOIDECTOMY    . OTHER SURGICAL HISTORY     hematoma surgery on head as an infant  . TONSILLECTOMY AND ADENOIDECTOMY    . WISDOM TOOTH EXTRACTION      Current Outpatient  Medications  Medication Sig Dispense Refill  . atenolol (TENORMIN) 25 MG tablet     . azelastine (ASTELIN) 0.1 % nasal spray Place 2 sprays into both nostrils at bedtime. 30 mL 5  . beclomethasone (QVAR) 80 MCG/ACT inhaler Inhale 2 puffs into the lungs 2 (two) times daily. 1 Inhaler 5  . clindamycin (CLEOCIN T) 1 % external solution daily as needed.    . clindamycin-benzoyl peroxide (BENZACLIN) gel Apply 1 application topically every morning.    Marland Kitchen EPINEPHrine (AUVI-Q) 0.3 mg/0.3 mL IJ SOAJ injection Use as directed for severe allergic reaction 2 each 2  . fexofenadine (ALLEGRA) 180 MG tablet Take 180 mg by mouth daily.    Marland Kitchen Galcanezumab-gnlm (EMGALITY) 120 MG/ML SOAJ Inject into the skin.    . hydrocortisone 2.5 % cream SMARTSIG:Sparingly Topical Twice Daily    . ibuprofen (ADVIL,MOTRIN) 800 MG tablet Take 1 tablet (800 mg total) by mouth every 8 (eight) hours as needed. 30 tablet 2  . lansoprazole (PREVACID) 30 MG capsule 1 capsule daily for reflux. 90 capsule 3  . levalbuterol (XOPENEX HFA) 45 MCG/ACT inhaler Inhale 2 puffs into the lungs every 6 (six) hours as needed for wheezing. 2 Inhaler 1  . levalbuterol (XOPENEX) 1.25 MG/3ML nebulizer solution Take 1.25 mg by nebulization every 4 (four) hours as needed for wheezing. 270 mL 1  . levocetirizine (XYZAL)  5 MG tablet Take 1 tablet once daily as needed. 90 tablet 0  . Levonorgestrel 19.5 MG IUD by Intrauterine route.    . meloxicam (MOBIC) 15 MG tablet Take by mouth.    . methocarbamol (ROBAXIN) 500 MG tablet Take 500 mg by mouth every 6 (six) hours as needed.    . methylphenidate 27 MG PO CR tablet Take by mouth.    . montelukast (SINGULAIR) 10 MG tablet Take 1 tablet once at night for coughing or wheezing. 90 tablet 3  . spironolactone (ALDACTONE) 50 MG tablet Take 50 mg by mouth daily.    Marland Kitchen tretinoin (RETIN-A) 0.025 % cream Pea sized amount in a thin layer to face nightly    . XHANCE 93 MCG/ACT EXHU Place 2 sprays into both nostrils in  the morning and at bedtime. 32 mL 5  . zonisamide (ZONEGRAN) 100 MG capsule Take by mouth.    Marland Kitchen omeprazole (PRILOSEC) 20 MG capsule Take 1 capsule (20 mg total) by mouth daily. 30 capsule 5   No current facility-administered medications for this visit.    Family History  Problem Relation Age of Onset  . Diabetes Mother   . Asthma Mother   . Allergic rhinitis Mother   . Colon cancer Maternal Grandmother   . Diabetes Maternal Grandmother   . Lung cancer Maternal Grandfather   . Stroke Paternal Grandmother   . Asthma Father   . Allergic rhinitis Father   . Breast cancer Maternal Aunt   . Pancreatic cancer Paternal Uncle     Review of Systems  All other systems reviewed and are negative.   Exam:   BP 104/80   Pulse 76   Resp 16   Ht 5\' 1"  (1.549 m)   Wt 143 lb (64.9 kg)   LMP 09/22/2020 (Exact Date)   BMI 27.02 kg/m     General appearance: alert, cooperative and appears stated age Head: normocephalic, without obvious abnormality, atraumatic Neck: no adenopathy, supple, symmetrical, trachea midline and thyroid normal to inspection and palpation Lungs: clear to auscultation bilaterally Breasts: normal appearance, no masses or tenderness, No nipple retraction or dimpling, No nipple discharge or bleeding, No axillary adenopathy Heart: regular rate and rhythm Abdomen: soft, non-tender; no masses, no organomegaly Extremities: extremities normal, atraumatic, no cyanosis or edema Skin: skin color, texture, turgor normal. No rashes or lesions Lymph nodes: cervical, supraclavicular, and axillary nodes normal. Neurologic: grossly normal  Pelvic: External genitalia:  no lesions              No abnormal inguinal nodes palpated.              Urethra:  normal appearing urethra with no masses, tenderness or lesions              Bartholins and Skenes: normal                 Vagina: normal appearing vagina with normal color and discharge, no lesions              Cervix: no lesions.  IUD  strings noted.               Pap taken: Yes.   Bimanual Exam:  Uterus:  normal size, contour, position, consistency, mobility, non-tender              Adnexa: no mass, fullness, tenderness      Chaperone was present for exam.  Assessment:   Well woman visit with normal exam. Kyleena IUD.  Hx migraine HA with aura.  Controlled on medication. Dysmenorrhea. FH breast cancer in maternal aunt age 20.    Plan: Mammogram screening currently at age 3.   She will research if her aunt had genetic testing.  Self breast awareness reviewed. Pap and HR HPV as above. Guidelines for Calcium, Vitamin D, regular exercise program including cardiovascular and weight bearing exercise. We discussed options for treating dysmenorrhea - NSAIDs, adding Micronor, Switching to Depo Provera, Freida Busman, Depo Lupron, and laparoscopic surgery.  Rx for Motrin.  Advised not to also take Meloxicam.  Follow up annually and prn.   After visit summary provided.

## 2020-10-02 ENCOUNTER — Other Ambulatory Visit (HOSPITAL_COMMUNITY)
Admission: RE | Admit: 2020-10-02 | Discharge: 2020-10-02 | Disposition: A | Discharge status: Home / Self Care | Payer: BC Managed Care – PPO | Source: Ambulatory Visit | Attending: Obstetrics and Gynecology | Admitting: Obstetrics and Gynecology

## 2020-10-02 ENCOUNTER — Ambulatory Visit: Payer: BC Managed Care – PPO | Admitting: Obstetrics and Gynecology

## 2020-10-02 ENCOUNTER — Other Ambulatory Visit: Payer: Self-pay

## 2020-10-02 ENCOUNTER — Encounter: Payer: Self-pay | Admitting: Obstetrics and Gynecology

## 2020-10-02 VITALS — BP 104/80 | HR 76 | Resp 16 | Ht 61.0 in | Wt 143.0 lb

## 2020-10-02 DIAGNOSIS — Z01419 Encounter for gynecological examination (general) (routine) without abnormal findings: Secondary | ICD-10-CM | POA: Insufficient documentation

## 2020-10-02 MED ORDER — IBUPROFEN 800 MG PO TABS: 800.0000 mg | ORAL_TABLET | Freq: Three times a day (TID) | ORAL | 2 refills | Status: DC | PRN

## 2020-10-02 NOTE — Patient Instructions (Signed)

## 2020-10-03 LAB — CYTOLOGY - PAP
Comment: NEGATIVE
Diagnosis: NEGATIVE
High risk HPV: NEGATIVE

## 2020-10-10 ENCOUNTER — Telehealth: Payer: Self-pay | Admitting: Allergy & Immunology

## 2020-10-10 NOTE — Telephone Encounter (Signed)
Patient called and needs to have singulair called to [facility] on [address]. [phone]

## 2020-10-11 ENCOUNTER — Other Ambulatory Visit: Payer: Self-pay

## 2020-10-11 MED ORDER — MONTELUKAST SODIUM 10 MG PO TABS: ORAL_TABLET | ORAL | 1 refills | Status: DC

## 2020-10-11 MED ORDER — MONTELUKAST SODIUM 10 MG PO TABS
ORAL_TABLET | ORAL | 3 refills | Status: DC
Start: 2020-10-11 — End: 2020-10-11

## 2020-10-11 NOTE — Addendum Note (Signed)
Addended by: Lucrezia Starch I on: 10/11/2020 09:34 AM   Modules accepted: Orders

## 2020-11-27 ENCOUNTER — Other Ambulatory Visit: Payer: Self-pay | Admitting: Allergy & Immunology

## 2020-11-28 ENCOUNTER — Other Ambulatory Visit: Payer: Self-pay | Admitting: *Deleted

## 2020-12-23 ENCOUNTER — Encounter: Payer: Self-pay | Admitting: Podiatry

## 2020-12-23 ENCOUNTER — Ambulatory Visit: Payer: BC Managed Care – PPO | Admitting: Podiatry

## 2020-12-23 ENCOUNTER — Other Ambulatory Visit: Payer: Self-pay

## 2020-12-23 DIAGNOSIS — L600 Ingrowing nail: Secondary | ICD-10-CM | POA: Diagnosis not present

## 2020-12-23 NOTE — Progress Notes (Signed)
Subjective:   Patient ID: Stephanie Simpson, female   DOB: 24 y.o.   MRN: [id number]   HPI Patient presents stating she has some irritated tissue on both the insides of her big toes and she was concerned about this giving her problems   ROS      Objective:  Physical Exam  Neurovascular status intact with crusted tissue hallux border bilateral medial side with moderate pain     Assessment:  Ingrown toenail deformity crusted over scab hallux bilateral     Plan:  H&P debrided out tissue cleaned out the bed no active drainage noted

## 2021-01-06 ENCOUNTER — Other Ambulatory Visit: Payer: Self-pay | Admitting: Allergy & Immunology

## 2021-01-23 ENCOUNTER — Ambulatory Visit: Payer: BC Managed Care – PPO | Admitting: Allergy & Immunology

## 2021-01-23 ENCOUNTER — Other Ambulatory Visit: Payer: Self-pay

## 2021-01-23 ENCOUNTER — Encounter: Payer: Self-pay | Admitting: Allergy & Immunology

## 2021-01-23 VITALS — BP 102/82 | HR 91 | Temp 97.9°F | Resp 18 | Ht 60.0 in | Wt 148.4 lb

## 2021-01-23 DIAGNOSIS — J3089 Other allergic rhinitis: Secondary | ICD-10-CM

## 2021-01-23 DIAGNOSIS — K219 Gastro-esophageal reflux disease without esophagitis: Secondary | ICD-10-CM

## 2021-01-23 DIAGNOSIS — J302 Other seasonal allergic rhinitis: Secondary | ICD-10-CM

## 2021-01-23 DIAGNOSIS — T7800XD Anaphylactic reaction due to unspecified food, subsequent encounter: Secondary | ICD-10-CM | POA: Diagnosis not present

## 2021-01-23 DIAGNOSIS — J4530 Mild persistent asthma, uncomplicated: Secondary | ICD-10-CM | POA: Diagnosis not present

## 2021-01-23 MED ORDER — OMEPRAZOLE 20 MG PO CPDR
20.0000 mg | DELAYED_RELEASE_CAPSULE | Freq: Every day | ORAL | 5 refills | Status: DC
Start: 2021-01-23 — End: 2022-02-26

## 2021-01-23 MED ORDER — MONTELUKAST SODIUM 10 MG PO TABS: ORAL_TABLET | ORAL | 2 refills | Status: DC

## 2021-01-23 MED ORDER — LEVOCETIRIZINE DIHYDROCHLORIDE 5 MG PO TABS: ORAL_TABLET | ORAL | 2 refills | Status: DC

## 2021-01-23 NOTE — Progress Notes (Signed)
FOLLOW UP  Date of Service/Encounter:  01/23/21   Assessment:   Mild persistent asthma, uncomplicated  Anaphylactic shock due to food(peanuts)  Allergic rhinitis (grasses, weeds, trees, dust mites, dog)- s/p 5+ years of immunotherapy when she was in middle/high school  GERD - well controlled   Lydian continues to have issues with breakthrough allergy symptoms.  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  I think she would do better if she would take showers after coming home in the evenings.  She is open to doing that.  She is also going to start doing nasal saline rinses.  We provided her with a kit so that we can get that started.  I think that another round of allergen immunotherapy would be very helpful for her, but she does not want to go through all that at this point in time.  Her asthma is under good control with the current regimen.  She is not interested in oral immunotherapy for her peanut allergy.  Plan/Recommendations:   1. Mild persistent asthma, uncomplicated - Lung testing looks great today.  - We will not make any medication changes.  - Daily controller medication(s): Singulair 49m daily  - Prior to physical activity: Xopenex 2 puffs 10-15 minutes before physical activity. - Rescue medications: Xopenex 4 puffs every 4-6 hours as needed - Changes during respiratory infections or worsening symptoms: Add on Qvar 870m to 4 puffs twice daily for TWO WEEKS. - Asthma control goals:  * Full participation in all desired activities (may need albuterol before activity) * Albuterol use two time or less a week on average (not counting use with activity) * Cough interfering with sleep two time or less a month * Oral steroids no more than once a year * No hospitalizations  2. Anaphylactic shock due to food (peanuts) - Continue to avoid peanuts.  - Wynona Lunaepinephrine) is up to date.   3. Allergic rhinitis - Continue with Xyzal and Allegra.  -  Continue with Ryvent 21m57mvery 8 hours as needed for breakthrough symptoms (this can cause sleepiness). - Continue with Singulair 32m87mily. - Continue with Xhance two sprays per nostril twice daily. - Continue with Astelin one spray per nostril at night (this is a nasal antihistamine).  - Add on nasal saline rinses (rinse kit provided).  4. Return in about 6 months (around 07/23/2021).    Subjective:   AldaMaddison Kilneral is a 24 y38. female presenting today for follow up of  Chief Complaint  Patient presents with  . Asthma    No issues thus far    AldaUSAA a history of the following: Patient Active Problem List   Diagnosis Date Noted  . Lichen planus linearis 11/03/2018  . Granuloma annulare 07/01/2017  . Moderate persistent asthma without complication 12/169/48/5462Gastroesophageal reflux disease without esophagitis 05/11/2016  . Allergic rhinitis 12/18/2015  . Mild persistent asthma 12/18/2015  . GERD (gastroesophageal reflux disease) 12/18/2015  . Anaphylactic shock due to adverse food reaction 12/18/2015  . Seborrheic dermatitis, unspecified 07/11/2015  . Folliculitis 07/170/35/0093Migraine without aura 07/07/2012  . Acne 12/16/2011  . Keratosis pilaris 12/16/2011    History obtained from: chart review and patient.  AldaShenaa 24 y29. female presenting for a follow up visit.  She was last seen in July 2021.  At that time, her lung testing looked great.  We continue with Singulair daily as well  as Xopenex as needed.  She has Qvar that she has during respiratory flares.  She continue to avoid peanuts.  For her allergic rhinitis, we added on RyVent 6 mg every 8 hours as needed and continued with XHANCE and Astelin.  Since the last visit, she has done very well.  She is reading a fantasy book as a walk-in today.  She tends to go from 1 daughter to another.  Asthma/Respiratory Symptom History: She remains on the Singulair daily. She has not been using her rescue inhaler at  all.  Lanaysia's asthma has been well controlled. She has not required rescue medication, experienced nocturnal awakenings due to lower respiratory symptoms, nor have activities of daily living been limited. She has required no Emergency Department or Urgent Care visits for her asthma. She has required zero courses of systemic steroids for asthma exacerbations since the last visit. ACT score today is 25, indicating excellent asthma symptom control.   Allergic Rhinitis Symptom History: The RyVent did end up helping. Now she is reporting that her nose is very dry. It is not drippy at all and it is super dry and congested at night. She does not take a shower at night when she gets home. She reports that the dry nose is a new thing for her. It has been happening over the last month or so. She has been doing a lot of cleaning of her storage unit. She definitely feels better than before she went through all of this. She does not use nasal saline rinses.   Food Allergy Symptom History: She continues to avoid peanuts.  She is not interested in immunotherapy for peanuts.  Her EpiPen is up-to-date.  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 She is finishing the application. It is open May through September. Then in September they will review the applications and send requests for interview next spring. Then she would be starting fall 2023.  She had a good Christmas.  She and her family went to [employer].  She still has a couple friends who still works there.  Otherwise, there have been no changes to her past medical history, surgical history, family history, or social history.    Review of Systems  Constitutional: Negative.  Negative for chills, fever, malaise/fatigue and weight loss.  HENT: Positive for congestion. Negative for ear discharge, ear pain and sinus pain.   Eyes: Negative for pain, discharge and redness.  Respiratory: Negative for cough,  sputum production, shortness of breath and wheezing.   Cardiovascular: Negative.  Negative for chest pain and palpitations.  Gastrointestinal: Negative for abdominal pain, constipation, diarrhea, heartburn, nausea and vomiting.  Skin: Negative.  Negative for itching and rash.  Neurological: Negative for dizziness and headaches.  Endo/Heme/Allergies: Positive for environmental allergies. Does not bruise/bleed easily.       Objective:   Blood pressure 102/82, pulse 91, temperature 97.9 F (36.6 C), resp. rate 18, height 5' (1.524 m), weight 148 lb 6.4 oz (67.3 kg), SpO2 98 %. Body mass index is 28.98 kg/m.   Physical Exam:  Physical Exam Constitutional:      Appearance: She is well-developed.  HENT:     Head: Normocephalic and atraumatic.     Right Ear: Tympanic membrane, ear canal and external ear normal.     Left Ear: Tympanic membrane, ear canal and external ear normal.     Nose: No nasal deformity, septal deviation, mucosal edema, rhinorrhea or epistaxis.  Right Turbinates: Enlarged, swollen and pale.     Left Turbinates: Enlarged, swollen and pale.     Right Sinus: No maxillary sinus tenderness or frontal sinus tenderness.     Left Sinus: No maxillary sinus tenderness or frontal sinus tenderness.     Comments: Rhinorrhea bilaterally.     Mouth/Throat:     Mouth: Oropharynx is clear and moist. Mucous membranes are not pale and not dry.     Pharynx: Uvula midline.  Eyes:     General:        Right eye: No discharge.        Left eye: No discharge.     Extraocular Movements: EOM normal.     Conjunctiva/sclera: Conjunctivae normal.     Right eye: Right conjunctiva is not injected. No chemosis.    Left eye: Left conjunctiva is not injected. No chemosis.    Pupils: Pupils are equal, round, and reactive to light.  Cardiovascular:     Rate and Rhythm: Normal rate and regular rhythm.     Heart sounds: Normal heart sounds.  Pulmonary:     Effort: Pulmonary effort is normal.  No tachypnea, accessory muscle usage or respiratory distress.     Breath sounds: Normal breath sounds. No wheezing, rhonchi or rales.     Comments: Moving air well in all lung fields.  No increased work of breathing. Chest:     Chest wall: No tenderness.  Lymphadenopathy:     Cervical: No cervical adenopathy.  Skin:    Coloration: Skin is not pale.     Findings: No abrasion, erythema, petechiae or rash. Rash is not papular, urticarial or vesicular.  Neurological:     Mental Status: She is alert.  Psychiatric:        Mood and Affect: Mood and affect normal.      Diagnostic studies:    Spirometry: results normal (FEV1: 2.62/106%, FVC: 3.21/114%, FEV1/FVC: 82%).    Spirometry consistent with normal pattern.   Allergy Studies: none       Salvatore Marvel, MD  [facility]

## 2021-01-23 NOTE — Patient Instructions (Addendum)
1. Mild persistent asthma, uncomplicated - Lung testing looks great today.  - We will not make any medication changes.  - Daily controller medication(s): Singulair 75m daily  - Prior to physical activity: Xopenex 2 puffs 10-15 minutes before physical activity. - Rescue medications: Xopenex 4 puffs every 4-6 hours as needed - Changes during respiratory infections or worsening symptoms: Add on Qvar 886m to 4 puffs twice daily for TWO WEEKS. - Asthma control goals:  * Full participation in all desired activities (may need albuterol before activity) * Albuterol use two time or less a week on average (not counting use with activity) * Cough interfering with sleep two time or less a month * Oral steroids no more than once a year * No hospitalizations  2. Anaphylactic shock due to food (peanuts) - Continue to avoid peanuts.  - Wynona Lunaepinephrine) is up to date.   3. Allergic rhinitis - Continue with Xyzal and Allegra.  - Continue with Ryvent 86m31mvery 8 hours as needed for breakthrough symptoms (this can cause sleepiness). - Continue with Singulair 37m41mily. - Continue with Xhance two sprays per nostril twice daily. - Continue with Astelin one spray per nostril at night (this is a nasal antihistamine).  - Add on nasal saline rinses (rinse kit provided).  4. Return in about 6 months (around 07/23/2021).    Please inform us oKoreaany Emergency Department visits, hospitalizations, or changes in symptoms. Call us bKoreaore going to the ED for breathing or allergy symptoms since we might be able to fit you in for a sick visit. Feel free to contact us aKoreatime with any questions, problems, or concerns.  It was a pleasure to see you again today!  Websites that have reliable patient information: 1. American Academy of Asthma, Allergy, and Immunology: www.aaaai.org 2. Food Allergy Research and Education (FARE): foodallergy.org 3. Mothers of Asthmatics: http://www.asthmacommunitynetwork.org 4.  American College of Allergy, Asthma, and Immunology: www.acaai.org   COVID-19 Vaccine Information can be found at: [email or web address] questions related to vaccine distribution or appointments, please email [email or web address] or call [phone]  "Like" us oKoreaFacebook and Instagram for our latest updates!       Make sure you are registered to vote! If you have moved or changed any of your contact information, you will need to get this updated before voting!  In some cases, you MAY be able to register to vote online: [email or web address]

## 2021-02-06 ENCOUNTER — Other Ambulatory Visit: Payer: Self-pay

## 2021-02-06 MED ORDER — XHANCE 93 MCG/ACT NA EXHU: INHALANT_SUSPENSION | NASAL | 5 refills | Status: DC

## 2021-04-21 ENCOUNTER — Telehealth: Payer: Self-pay | Admitting: Allergy & Immunology

## 2021-04-21 MED ORDER — XHANCE 93 MCG/ACT NA EXHU: INHALANT_SUSPENSION | NASAL | 5 refills | Status: DC

## 2021-04-21 MED ORDER — LEVALBUTEROL TARTRATE 45 MCG/ACT IN AERO: 2.0000 | INHALATION_SPRAY | Freq: Four times a day (QID) | RESPIRATORY_TRACT | 2 refills | Status: DC | PRN

## 2021-04-21 NOTE — Telephone Encounter (Signed)
Sent in a refill for the Levalbuterol inhaler 45 Sent to [facility] [place], [place]   Informed patient as well

## 2021-04-21 NOTE — Telephone Encounter (Signed)
Pt is requesting refill for Xopanez inhaler, [facility], [place], [place] [address]) and [address].   Please advise.

## 2021-04-21 NOTE — Telephone Encounter (Signed)
Also sent in [place] to [facility] specialty pharmacy

## 2021-04-21 NOTE — Addendum Note (Signed)
Addended by: Isabel Caprice on: 04/21/2021 02:51 PM   Modules accepted: Orders

## 2021-06-20 NOTE — Telephone Encounter (Signed)
Pts pa has been left on gallaghers desk to be signed and then faxed

## 2021-06-24 NOTE — Telephone Encounter (Signed)
PA has been signed and faxed to [phone].

## 2021-06-26 NOTE — Telephone Encounter (Signed)
Pa approved pharmacy notified

## 2021-07-15 ENCOUNTER — Ambulatory Visit: Payer: Self-pay | Admitting: Allergy & Immunology

## 2021-08-21 ENCOUNTER — Other Ambulatory Visit: Payer: Self-pay

## 2021-08-21 ENCOUNTER — Encounter: Payer: Self-pay | Admitting: Allergy & Immunology

## 2021-08-21 ENCOUNTER — Ambulatory Visit: Payer: BC Managed Care – PPO | Admitting: Allergy & Immunology

## 2021-08-21 VITALS — BP 118/78 | HR 88 | Temp 97.8°F | Resp 16 | Ht 61.0 in | Wt 143.6 lb

## 2021-08-21 DIAGNOSIS — J4530 Mild persistent asthma, uncomplicated: Secondary | ICD-10-CM

## 2021-08-21 DIAGNOSIS — J3089 Other allergic rhinitis: Secondary | ICD-10-CM | POA: Diagnosis not present

## 2021-08-21 DIAGNOSIS — K219 Gastro-esophageal reflux disease without esophagitis: Secondary | ICD-10-CM

## 2021-08-21 DIAGNOSIS — J302 Other seasonal allergic rhinitis: Secondary | ICD-10-CM

## 2021-08-21 DIAGNOSIS — J331 Polypoid sinus degeneration: Secondary | ICD-10-CM | POA: Diagnosis not present

## 2021-08-21 DIAGNOSIS — T7800XD Anaphylactic reaction due to unspecified food, subsequent encounter: Secondary | ICD-10-CM

## 2021-08-21 MED ORDER — LEVALBUTEROL TARTRATE 45 MCG/ACT IN AERO: 2.0000 | INHALATION_SPRAY | Freq: Four times a day (QID) | RESPIRATORY_TRACT | 1 refills | Status: DC | PRN

## 2021-08-21 MED ORDER — LEVOCETIRIZINE DIHYDROCHLORIDE 5 MG PO TABS: 5.0000 mg | ORAL_TABLET | Freq: Every evening | ORAL | 1 refills | Status: DC

## 2021-08-21 MED ORDER — MONTELUKAST SODIUM 10 MG PO TABS: 10.0000 mg | ORAL_TABLET | Freq: Every day | ORAL | 1 refills | Status: DC

## 2021-08-21 NOTE — Patient Instructions (Addendum)
1. Mild persistent asthma, uncomplicated - Lung testing looks great today. - Daily controller medication(s): Singulair '10mg'$  daily  - Prior to physical activity: Xopenex 2 puffs 10-15 minutes before physical activity. - Rescue medications: Xopenex 4 puffs every 4-6 hours as needed - Changes during respiratory infections or worsening symptoms: Add on Armonair 275mg 1 puff twice daily for ONE TO TWO WEEKS. - Asthma control goals:  * Full participation in all desired activities (may need albuterol before activity) * Albuterol use two time or less a week on average (not counting use with activity) * Cough interfering with sleep two time or less a month * Oral steroids no more than once a year * No hospitalizations  2. Anaphylactic shock due to food (peanuts) - Continue to avoid peanuts.  -[name](epinephrine) is up to date.   3. Allergic rhinitis - Continue with Xyzal and Allegra.  - Continue with Singulair '10mg'$  daily. - Continue with Xhance two sprays per nostril twice daily ( we will [facility] to make [facility] happen).  - Continue with Astelin one spray per nostril AS NEEDED (up to three times daily).    4. Return in about 6 months (around 02/21/2022).    Please inform uKoreaof any Emergency Department visits, hospitalizations, or changes in symptoms. Call uKoreabefore going to the ED for breathing or allergy symptoms since we might be able to fit you in for a sick visit. Feel free to contact uKoreaanytime with any questions, problems, or concerns.  It was a pleasure to see you again today!  Websites that have reliable patient information: 1. American Academy of Asthma, Allergy, and Immunology: www.aaaai.org 2. Food Allergy Research and Education (FARE): foodallergy.org 3. Mothers of Asthmatics: http://www.asthmacommunitynetwork.org 4. American College of Allergy, Asthma, and Immunology: www.acaai.org   COVID-19 Vaccine Information can be found at:  [email or web address] questions related to vaccine distribution or appointments, please email [email or web address] or call [phone]     "Like" uKoreaon Facebook and Instagram for our latest updates!       Make sure you are registered to vote! If you have moved or changed any of your contact information, you will need to get this updated before voting!  In some cases, you MAY be able to register to vote online: [email or web address]

## 2021-08-21 NOTE — Progress Notes (Signed)
FOLLOW UP  Date of Service/Encounter:  08/21/21   Assessment:   Mild persistent asthma, uncomplicated   Anaphylactic shock due to food (peanuts)   Allergic rhinitis (grasses, weeds, trees, dust mites, dog) - s/p 5+ years of immunotherapy when she was in middle/high school   GERD - well controlled    Plan/Recommendations:   1. Mild persistent asthma, uncomplicated - Lung testing looks great today. - Daily controller medication(s): Singulair '10mg'$  daily  - Prior to physical activity: Xopenex 2 puffs 10-15 minutes before physical activity. - Rescue medications: Xopenex 4 puffs every 4-6 hours as needed - Changes during respiratory infections or worsening symptoms: Add on Armonair 221mg 1 puff twice daily for ONE TO TWO WEEKS. - Asthma control goals:  * Full participation in all desired activities (may need albuterol before activity) * Albuterol use two time or less a week on average (not counting use with activity) * Cough interfering with sleep two time or less a month * Oral steroids no more than once a year * No hospitalizations  2. Anaphylactic shock due to food (peanuts) - Continue to avoid peanuts.  -[name](epinephrine) is up to date.   3. Allergic rhinitis - Continue with Xyzal and Allegra.  - Continue with Singulair '10mg'$  daily. - Continue with Xhance two sprays per nostril twice daily ( [facility] [place] [facility] happen).  - Continue with Astelin one spray per nostril AS NEEDED (up to three times daily).    4. Return in about 6 months (around 02/21/2022).   Subjective:   Stephanie Simpson is a 25y.o. female presenting today for follow up of  Chief Complaint  Patient presents with   Asthma    Stephanie Simpson has a history of the following: Patient Active Problem List   Diagnosis Date Noted   Lichen planus linearis 11/03/2018   Granuloma annulare 07/01/2017   Moderate persistent asthma without complication [id number]  Gastroesophageal  reflux disease without esophagitis 05/11/2016   Allergic rhinitis 12/18/2015   Mild persistent asthma 12/18/2015   GERD (gastroesophageal reflux disease) 12/18/2015   Anaphylactic shock due to adverse food reaction 12/18/2015   Seborrheic dermatitis, unspecified [id number]  Folliculitis [id number]  Migraine without aura 07/07/2012   Acne 12/16/2011   Keratosis pilaris 12/16/2011    History obtained from: chart review and patient.  Stephanie Simpson a 25y.o. female presenting for a follow up visit.  She was last seen in January 2022.  At that time, she was having issues with breakthrough allergy symptoms.  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  We recommended taking showers after coming home from her job in the evenings.  We also recommended doing nasal saline rinses.  She has been through allergen immunotherapy and did not want to start a second course of that.  For her allergic rhinitis, would continue with Xyzal and Allegra.  I also added RyVent every 8 hours as needed for breakthrough symptoms.  We continue with Singulair as well as Xhance and Astelin.  Her asthma is controlled with Singulair with Xopenex as needed and Qvar added during respiratory flares.  Since last visit, she has done fairly well.  Asthma/Respiratory Symptom History: Asthma is controlled with the as needed use of Xopenex.  She also remains on the Singulair . She is doing well. No Qvar use at all.  She is unsure how much Qvar  cost.  She does keep 1 that is up-to-date, but never really has to use it.  Allergic Rhinitis Symptom History: She has done worse since moving back to [place]. She is on [employer], mining. She was on Xhance but it increased to $100 per month.  She is wondering if there is something that can be done about the cost.  Food Allergy Symptom History: She is not eating any peanuts.  She does have an up-to-date EpiPen.  She needs a medical terminology class. She is  also looking for an animal nutrition class.   Otherwise, there have been no changes to her past medical history, surgical history, family history, or social history.    Review of Systems  Constitutional: Negative.  Negative for fever, malaise/fatigue and weight loss.  HENT: Negative.  Negative for congestion, ear discharge and ear pain.   Eyes:  Negative for pain, discharge and redness.  Respiratory:  Negative for cough, sputum production, shortness of breath and wheezing.   Cardiovascular: Negative.  Negative for chest pain and palpitations.  Gastrointestinal:  Negative for abdominal pain, heartburn, nausea and vomiting.  Skin: Negative.  Negative for itching and rash.  Neurological:  Negative for dizziness and headaches.  Endo/Heme/Allergies:  Negative for environmental allergies. Does not bruise/bleed easily.      Objective:   Blood pressure 118/78, pulse 88, temperature 97.8 F (36.6 C), resp. rate 16, height '5\' 1"'$  (1.549 m), weight 143 lb 9.6 oz (65.1 kg), SpO2 98 %. Body mass index is 27.13 kg/m.   Physical Exam:  Physical Exam Constitutional:      Appearance: She is well-developed.  HENT:     Head: Normocephalic and atraumatic.     Right Ear: Tympanic membrane, ear canal and external ear normal.     Left Ear: Tympanic membrane, ear canal and external ear normal.     Nose: No nasal deformity, septal deviation, mucosal edema or rhinorrhea.     Right Turbinates: Enlarged, swollen and pale.     Left Turbinates: Enlarged, swollen and pale.     Right Sinus: No maxillary sinus tenderness or frontal sinus tenderness.     Left Sinus: No maxillary sinus tenderness or frontal sinus tenderness.     Comments: Polypoid sinus degeneration noted bilaterally.     Mouth/Throat:     Mouth: Mucous membranes are not pale and not dry.     Pharynx: Uvula midline.  Eyes:     General: Lids are normal. No allergic shiner.       Right eye: No discharge.        Left eye: No discharge.      Conjunctiva/sclera: Conjunctivae normal.     Right eye: Right conjunctiva is not injected. No chemosis.    Left eye: Left conjunctiva is not injected. No chemosis.    Pupils: Pupils are equal, round, and reactive to light.  Cardiovascular:     Rate and Rhythm: Normal rate and regular rhythm.     Heart sounds: Normal heart sounds.  Pulmonary:     Effort: Pulmonary effort is normal. No tachypnea, accessory muscle usage or respiratory distress.     Breath sounds: Normal breath sounds. No wheezing, rhonchi or rales.  Chest:     Chest wall: No tenderness.  Lymphadenopathy:     Cervical: No cervical adenopathy.  Skin:    Coloration: Skin is not pale.     Findings: No abrasion, erythema, petechiae or rash. Rash is not papular, urticarial or vesicular.  Neurological:  Mental Status: She is alert.  Psychiatric:        Behavior: Behavior is cooperative.     Diagnostic studies:    Spirometry: results normal (FEV1: 2.59/102%, FVC: 3.11/106%, FEV1/FVC: 83%).    Spirometry consistent with normal pattern.   Allergy Studies: none        Salvatore Marvel, MD  [facility]

## 2021-08-25 ENCOUNTER — Telehealth: Payer: Self-pay | Admitting: *Deleted

## 2021-08-25 ENCOUNTER — Other Ambulatory Visit: Payer: Self-pay | Admitting: *Deleted

## 2021-08-25 ENCOUNTER — Encounter: Payer: Self-pay | Admitting: Allergy & Immunology

## 2021-08-25 NOTE — Telephone Encounter (Signed)
PA has been submitted through CoverMyMeds for Levalbuterol and is currently pending approval/denial.  

## 2021-08-26 NOTE — Telephone Encounter (Signed)
PA was denied for Levalbuterol. Does patient have adverse reaction to Albuterol?

## 2021-08-28 NOTE — Telephone Encounter (Signed)
She has tachycardia to albuterol.

## 2021-08-28 NOTE — Telephone Encounter (Signed)
PA has been resubmitted through CoverMyMeds and is currently pending.  °

## 2021-09-02 ENCOUNTER — Other Ambulatory Visit: Payer: Self-pay

## 2021-09-02 MED ORDER — MOMETASONE FUROATE 50 MCG/ACT NA SUSP: 2.0000 | Freq: Every day | NASAL | 1 refills | Status: DC

## 2021-09-27 DIAGNOSIS — J0100 Acute maxillary sinusitis, unspecified: Secondary | ICD-10-CM | POA: Diagnosis not present

## 2021-10-02 DIAGNOSIS — F909 Attention-deficit hyperactivity disorder, unspecified type: Secondary | ICD-10-CM | POA: Diagnosis not present

## 2021-10-02 DIAGNOSIS — Z23 Encounter for immunization: Secondary | ICD-10-CM | POA: Diagnosis not present

## 2021-10-13 DIAGNOSIS — G43719 Chronic migraine without aura, intractable, without status migrainosus: Secondary | ICD-10-CM | POA: Diagnosis not present

## 2021-12-25 ENCOUNTER — Telehealth: Payer: Self-pay | Admitting: Allergy & Immunology

## 2021-12-25 MED ORDER — XHANCE 93 MCG/ACT NA EXHU: INHALANT_SUSPENSION | NASAL | 2 refills | Status: DC

## 2021-12-25 NOTE — Telephone Encounter (Signed)
Stephanie Simpson called in and states she normally has [name] mailed to her every month and she noticed she didn't receive a delivery this month.  She has an appointment on 02/26/2022 with Dr. Ernst Bowler and is wondering if a new prescription needed to be called in.  Patient states she is out and would like a phone call.

## 2021-12-25 NOTE — Telephone Encounter (Signed)
Called and spoke to patient to inform her that her Truett Perna has been sent in and to call us on Monday if she hasn't heard from them. Patient verbalized understanding.
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
## 2022-02-16 ENCOUNTER — Ambulatory Visit (INDEPENDENT_AMBULATORY_CARE_PROVIDER_SITE_OTHER): Payer: BC Managed Care – PPO | Admitting: Obstetrics and Gynecology

## 2022-02-16 ENCOUNTER — Other Ambulatory Visit: Payer: Self-pay

## 2022-02-16 ENCOUNTER — Encounter: Payer: Self-pay | Admitting: Obstetrics and Gynecology

## 2022-02-16 VITALS — BP 110/80 | HR 75 | Ht 61.0 in | Wt 146.0 lb

## 2022-02-16 DIAGNOSIS — Z01419 Encounter for gynecological examination (general) (routine) without abnormal findings: Secondary | ICD-10-CM | POA: Diagnosis not present

## 2022-02-16 DIAGNOSIS — Z3009 Encounter for other general counseling and advice on contraception: Secondary | ICD-10-CM

## 2022-02-16 MED ORDER — MISOPROSTOL 200 MCG PO TABS: ORAL_TABLET | ORAL | 0 refills | Status: DC

## 2022-02-16 NOTE — Patient Instructions (Signed)

## 2022-02-18 ENCOUNTER — Other Ambulatory Visit: Payer: Self-pay | Admitting: Allergy & Immunology

## 2022-02-25 DIAGNOSIS — D485 Neoplasm of uncertain behavior of skin: Secondary | ICD-10-CM | POA: Diagnosis not present

## 2022-02-25 DIAGNOSIS — H5213 Myopia, bilateral: Secondary | ICD-10-CM | POA: Diagnosis not present

## 2022-02-25 DIAGNOSIS — H31091 Other chorioretinal scars, right eye: Secondary | ICD-10-CM | POA: Diagnosis not present

## 2022-02-25 DIAGNOSIS — H04123 Dry eye syndrome of bilateral lacrimal glands: Secondary | ICD-10-CM | POA: Diagnosis not present

## 2022-02-26 ENCOUNTER — Encounter: Payer: Self-pay | Admitting: Allergy & Immunology

## 2022-02-26 ENCOUNTER — Other Ambulatory Visit: Payer: Self-pay

## 2022-02-26 ENCOUNTER — Ambulatory Visit: Payer: BC Managed Care – PPO | Admitting: Allergy & Immunology

## 2022-02-26 VITALS — BP 136/80 | HR 81 | Temp 96.7°F | Resp 16 | Ht 60.0 in | Wt 149.6 lb

## 2022-02-26 DIAGNOSIS — J4530 Mild persistent asthma, uncomplicated: Secondary | ICD-10-CM | POA: Diagnosis not present

## 2022-02-26 DIAGNOSIS — T7800XD Anaphylactic reaction due to unspecified food, subsequent encounter: Secondary | ICD-10-CM

## 2022-02-26 DIAGNOSIS — J331 Polypoid sinus degeneration: Secondary | ICD-10-CM

## 2022-02-26 DIAGNOSIS — J302 Other seasonal allergic rhinitis: Secondary | ICD-10-CM

## 2022-02-26 DIAGNOSIS — J3089 Other allergic rhinitis: Secondary | ICD-10-CM

## 2022-02-26 DIAGNOSIS — K219 Gastro-esophageal reflux disease without esophagitis: Secondary | ICD-10-CM | POA: Diagnosis not present

## 2022-02-26 MED ORDER — OMEPRAZOLE 20 MG PO CPDR: 20.0000 mg | DELAYED_RELEASE_CAPSULE | Freq: Every day | ORAL | 2 refills | Status: DC

## 2022-02-26 MED ORDER — EPINEPHRINE 0.3 MG/0.3ML IJ SOAJ: INTRAMUSCULAR | 2 refills | Status: DC

## 2022-02-26 MED ORDER — MONTELUKAST SODIUM 10 MG PO TABS: 10.0000 mg | ORAL_TABLET | Freq: Every day | ORAL | 2 refills | Status: DC

## 2022-02-26 MED ORDER — LEVALBUTEROL TARTRATE 45 MCG/ACT IN AERO
2.0000 | INHALATION_SPRAY | Freq: Four times a day (QID) | RESPIRATORY_TRACT | 5 refills | Status: DC | PRN
Start: 2022-02-26 — End: 2022-09-17

## 2022-02-26 NOTE — Progress Notes (Signed)
? ?FOLLOW UP ? ?Date of Service/Encounter:  03/02/22 ? ? ?Assessment:  ? ?Mild persistent asthma, uncomplicated ?  ?Anaphylactic shock due to food (peanuts) ?  ?Allergic rhinitis (grasses, weeds, trees, dust mites, dog) - s/p 5+ years of immunotherapy when she was in middle/high school ?  ?GERD - well controlled ?  ? ?Plan/Recommendations:  ? ?1. Mild persistent asthma, uncomplicated ?- Lung testing looks great today. ?- Daily controller medication(s): Singulair 10mg  daily  ?- Prior to physical activity: Xopenex 2 puffs 10-15 minutes before physical activity. ?- Rescue medications: Xopenex 4 puffs every 4-6 hours as needed ?- Changes during respiratory infections or worsening symptoms: Add on Qvar 38mcg 2 puffs twice daily for ONE TO TWO WEEKS. ?- Asthma control goals:  ?* Full participation in all desired activities (may need albuterol before activity) ?* Albuterol use two time or less a week on average (not counting use with activity) ?* Cough interfering with sleep two time or less a month ?* Oral steroids no more than once a year ?* No hospitalizations ? ?2. Anaphylactic shock due to food (peanuts) ?- Continue to avoid peanuts.  ?- AuviQ (epinephrine) is up to date.  ? ?3. Allergic rhinitis ?- Continue with Xyzal and Allegra.  ?- Continue with Singulair 10mg  daily. ?- Hold the [place] and start Ryaltris one spray per nostril twice daily (similar to [place]). ?- If you like this better, call us and we can send it in.  ? ?4. Return in about 6 months (around 08/29/2022).  ? ? ?Subjective:  ? ?Stephanie Simpson is a 26 y.o. female presenting today for follow up of  ?Chief Complaint  ?Patient presents with  ? Follow-up  ? Asthma  ?  No flare ups  ? Allergic Rhinitis   ?  Ok. Weather changes have been bothering her but other than that no issues.  ? ? ?Stephanie Simpson has a history of the following: ?Patient Active Problem List  ? Diagnosis Date Noted  ? Lichen planus linearis 11/03/2018  ? Granuloma annulare 07/01/2017  ?  Moderate persistent asthma without complication 19/37/9024  ? Gastroesophageal reflux disease without esophagitis 05/11/2016  ? Allergic rhinitis 12/18/2015  ? Mild persistent asthma 12/18/2015  ? GERD (gastroesophageal reflux disease) 12/18/2015  ? Anaphylactic shock due to adverse food reaction 12/18/2015  ? Seborrheic dermatitis, unspecified 07/11/2015  ? Folliculitis 09/73/5329  ? Migraine without aura 07/07/2012  ? Acne 12/16/2011  ? Keratosis pilaris 12/16/2011  ? ? ?History obtained from: chart review and patient. ? ?Stephanie Simpson is a 26 y.o. female presenting for a follow up visit.  She was last seen in August 2022.  At that time, we continue with Singulair 10 mg daily as a monotherapy.  She also remained on Xopenex as needed.  She did have Qvar or Armonair to use 1 puff twice daily during flares.  She continues to avoid peanuts.  Allergic rhinitis was controlled with Xyzal and Allegra.  She also remained on Xhance 2 sprays per nostril up to twice daily. ? ?Since last visit, she is doing quite well. ? ?Asthma/Respiratory Symptom History: Asthma is under fair control.  She has not needed to add her inhaled steroid at all since last visit.  She is doing largely quite well.  Stephanie Simpson's asthma has been well controlled. She has not required rescue medication, experienced nocturnal awakenings due to lower respiratory symptoms, nor have activities of daily living been limited. She has required no Emergency Department or Urgent Care visits for her asthma.  She has required zero courses of systemic steroids for asthma exacerbations since the last visit. ACT score today is 25, indicating excellent asthma symptom control.  ? ?Allergic Rhinitis Symptom History: Allergies have been oyut of control lately. She does not use the Astelin. She remains on the [facility]. They continue to send it to her continuously. She is paying $50 per month for the [facility].  ? ?Allergic Rhinitis Symptom History: She has been on Dymista and Nasaonex. Stephanie Simpson  is much better than those.  She has not needed antibiotics at all.  She is currently paying around $50 a month for her nose spray, but it has been amazing. ? ?Food Allergy Symptom History: She has continued to avoid penaut.   There have been no accidental ingestions.  She does have an up-to-date EpiPen. ? ?She is looking into the conservation management masters degree at a school in [place]. She is applying to this [employer]) Youth worker Medicine. She is still on the fence about whether she wants to do veterinary medicine.  She is hoping that this conservation management Masters will give her some more time to decide.  This was another interest of hers as well, so she is hoping this will help her decide whether she wants to do conservation management more than veterinary medicine.  She has told her down already, but she has not mentioned this to her mother. ? ?Otherwise, there have been no changes to her past medical history, surgical history, family history, or social history. ? ? ? ?Review of Systems  ?Constitutional: Negative.  Negative for fever, malaise/fatigue and weight loss.  ?HENT: Negative.  Negative for congestion, ear discharge and ear pain.   ?Eyes:  Negative for pain, discharge and redness.  ?Respiratory:  Negative for cough, sputum production, shortness of breath and wheezing.   ?Cardiovascular: Negative.  Negative for chest pain and palpitations.  ?Gastrointestinal:  Negative for abdominal pain, constipation, diarrhea, heartburn, nausea and vomiting.  ?Skin: Negative.  Negative for itching and rash.  ?Neurological:  Negative for dizziness and headaches.  ?Endo/Heme/Allergies:  Negative for environmental allergies. Does not bruise/bleed easily.   ? ? ? ?Objective:  ? ?Blood pressure 136/80, pulse 81, temperature (!) 96.7 ?F (35.9 ?C), temperature source Temporal, resp. rate 16, height 5' (1.524 m), weight 149 lb 9.6 oz (67.9 kg), last menstrual period 02/13/2022, SpO2 100 %. ?Body mass index  is 29.22 kg/m?. ? ? ? ?Physical Exam ?Vitals reviewed.  ?Constitutional:   ?   Appearance: She is well-developed.  ?HENT:  ?   Head: Normocephalic and atraumatic.  ?   Right Ear: Tympanic membrane, ear canal and external ear normal.  ?   Left Ear: Tympanic membrane, ear canal and external ear normal.  ?   Nose: No nasal deformity, septal deviation, mucosal edema or rhinorrhea.  ?   Right Turbinates: Enlarged, swollen and pale.  ?   Left Turbinates: Enlarged, swollen and pale.  ?   Right Sinus: No maxillary sinus tenderness or frontal sinus tenderness.  ?   Left Sinus: No maxillary sinus tenderness or frontal sinus tenderness.  ?   Comments: Polypoid sinus degeneration noted bilaterally.  ?   Mouth/Throat:  ?   Mouth: Mucous membranes are not pale and not dry.  ?   Pharynx: Uvula midline.  ?Eyes:  ?   General: Lids are normal. No allergic shiner.    ?   Right eye: No discharge.     ?   Left eye: No discharge.  ?  Conjunctiva/sclera: Conjunctivae normal.  ?   Right eye: Right conjunctiva is not injected. No chemosis. ?   Left eye: Left conjunctiva is not injected. No chemosis. ?   Pupils: Pupils are equal, round, and reactive to light.  ?Cardiovascular:  ?   Rate and Rhythm: Normal rate and regular rhythm.  ?   Heart sounds: Normal heart sounds.  ?Pulmonary:  ?   Effort: Pulmonary effort is normal. No tachypnea, accessory muscle usage or respiratory distress.  ?   Breath sounds: Normal breath sounds. No wheezing, rhonchi or rales.  ?   Comments: Moving air well in all lung fields. ?Chest:  ?   Chest wall: No tenderness.  ?Lymphadenopathy:  ?   Cervical: No cervical adenopathy.  ?Skin: ?   Coloration: Skin is not pale.  ?   Findings: No abrasion, erythema, petechiae or rash. Rash is not papular, urticarial or vesicular.  ?Neurological:  ?   Mental Status: She is alert.  ?Psychiatric:     ?   Behavior: Behavior is cooperative.  ?  ? ?Diagnostic studies: none ? ? ? ? ?  ?Salvatore Marvel, MD  ?[facility] [place] [facility]  [place] ? ? ? ? ? ? ?

## 2022-02-26 NOTE — Patient Instructions (Addendum)
1. Mild persistent asthma, uncomplicated ?- Lung testing looks great today. ?- Daily controller medication(s): Singulair 10mg  daily  ?- Prior to physical activity: Xopenex 2 puffs 10-15 minutes before physical activity. ?- Rescue medications: Xopenex 4 puffs every 4-6 hours as needed ?- Changes during respiratory infections or worsening symptoms: Add on Qvar 51mcg 2 puffs twice daily for ONE TO TWO WEEKS. ?- Asthma control goals:  ?* Full participation in all desired activities (may need albuterol before activity) ?* Albuterol use two time or less a week on average (not counting use with activity) ?* Cough interfering with sleep two time or less a month ?* Oral steroids no more than once a year ?* No hospitalizations ? ?2. Anaphylactic shock due to food (peanuts) ?- Continue to avoid peanuts.  ?- AuviQ (epinephrine) is up to date.  ? ?3. Allergic rhinitis ?- Continue with Xyzal and Allegra.  ?- Continue with Singulair 10mg  daily. ?- Hold the [place] and start Ryaltris one spray per nostril twice daily (similar to [place]). ?- If you like this better, call us and we can send it in.  ? ?4. Return in about 6 months (around 08/29/2022).  ? ? ?Please inform us of any Emergency Department visits, hospitalizations, or changes in symptoms. Call us before going to the ED for breathing or allergy symptoms since we might be able to fit you in for a sick visit. Feel free to contact us anytime with any questions, problems, or concerns. ? ?It was a pleasure to see you again today! ? ?Websites that have reliable patient information: ?1. American Academy of Asthma, Allergy, and Immunology: www.aaaai.org ?2. Food Allergy Research and Education (FARE): foodallergy.org ?3. Mothers of Asthmatics: http://www.asthmacommunitynetwork.org ?4. [employer] of Allergy, Asthma, and Immunology: MonthlyElectricBill.co.uk ? ? ?COVID-19 Vaccine Information can be found at: ShippingScam.co.uk For  questions related to vaccine distribution or appointments, please email [email or web address] or call [phone].  ? ? ? ??Like? [place] on Facebook and Instagram for our latest updates!  ?  ? ? ? ?Make sure you are registered to vote! If you have moved or changed any of your contact information, you will need to get this updated before voting! ? ?In some cases, you MAY be able to register to vote online: [email or web address] ? ? ? ? ?

## 2022-03-02 ENCOUNTER — Encounter: Payer: Self-pay | Admitting: Allergy & Immunology

## 2022-03-19 MED ORDER — EPINEPHRINE 0.3 MG/0.3ML IJ SOAJ: 0.3000 mg | Freq: Once | INTRAMUSCULAR | 1 refills | Status: AC

## 2022-03-19 NOTE — Addendum Note (Signed)
Addended by: Clovis Cao A on: 03/19/2022 12:03 PM ? ? Modules accepted: Orders ? ?

## 2022-04-06 DIAGNOSIS — F909 Attention-deficit hyperactivity disorder, unspecified type: Secondary | ICD-10-CM | POA: Diagnosis not present
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
## 2022-05-11 ENCOUNTER — Ambulatory Visit (INDEPENDENT_AMBULATORY_CARE_PROVIDER_SITE_OTHER): Payer: BC Managed Care – PPO | Admitting: Obstetrics and Gynecology

## 2022-05-11 ENCOUNTER — Encounter: Payer: Self-pay | Admitting: Obstetrics and Gynecology

## 2022-05-11 VITALS — BP 108/78 | HR 77

## 2022-05-11 DIAGNOSIS — Z30433 Encounter for removal and reinsertion of intrauterine contraceptive device: Secondary | ICD-10-CM

## 2022-05-11 DIAGNOSIS — Z3009 Encounter for other general counseling and advice on contraception: Secondary | ICD-10-CM

## 2022-05-11 NOTE — Patient Instructions (Signed)
Intrauterine Device Insertion, Care After This sheet gives you information about how to care for yourself after your procedure. Your health care provider may also give you more specific instructions. If you have problems or questions, contact your health care provider. What can I expect after the procedure? After the procedure, it is common to have: Cramps and pain in the abdomen. Bleeding. It may be light or heavy. This may last for a few days. Lower back pain. Dizziness. Headaches. Nausea. Follow these instructions at home:  Before resuming sexual activity, check to make sure that you can feel the IUD string or strings. You should be able to feel the end of the string below the opening of your cervix. If your IUD string is in place, you may resume sexual activity. If you had a hormonal IUD inserted more than 7 days after your most recent period started, you will need to use a backup method of birth control for 7 days after IUD insertion. Ask your health care provider whether this applies to you. Continue to check that the IUD is still in place by feeling for the strings after every menstrual period, or once a month. An IUD will not protect you from sexually transmitted infections (STIs). Use methods to prevent the exchange of body fluids between partners (barrier protection) every time you have sex. Barrier protection can be used during oral, vaginal, or anal sex. Commonly used barrier methods include: Female condom. Female condom. Dental dam. Take over-the-counter and prescription medicines only as told by your health care provider. Keep all follow-up visits as told by your health care provider. This is important. Contact a health care provider if: You feel light-headed or weak. You have any of the following problems with your IUD string or strings: The string bothers or hurts you or your sexual partner. You cannot feel the string. The string has gotten longer. You can feel the IUD in  your vagina. You think you may be pregnant, or you miss your menstrual period. You think you may have a sexually transmitted infection (STI). Get help right away if: You have flu-like symptoms, such as tiredness (fatigue) and muscle aches. You have a fever and chills. You have bleeding that is heavier or lasts longer than a normal menstrual cycle. You have abnormal or bad-smelling discharge from your vagina. You develop abdominal pain that is new, is getting worse, or is not in the same area of earlier cramping and pain. You have pain during sexual activity. Summary After the procedure, it is common to have cramps and pain in the abdomen. It is also common to have light bleeding or heavier bleeding that is like your menstrual period. Continue to check that the IUD is still in place by feeling for the strings after every menstrual period, or once a month. Keep all follow-up visits as told by your health care provider. This is important. Contact your health care provider if you have problems with your IUD strings, such as the string getting longer or bothering you or your sexual partner. This information is not intended to replace advice given to you by your health care provider. Make sure you discuss any questions you have with your health care provider. Document Revised: 12/05/2019 Document Reviewed: 12/05/2019 Elsevier Patient Education  2022 Elsevier Inc.  
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
## 2022-06-11 ENCOUNTER — Ambulatory Visit (INDEPENDENT_AMBULATORY_CARE_PROVIDER_SITE_OTHER): Payer: BC Managed Care – PPO | Admitting: Obstetrics and Gynecology

## 2022-06-11 ENCOUNTER — Encounter: Payer: Self-pay | Admitting: Obstetrics and Gynecology

## 2022-06-11 VITALS — BP 114/70 | Ht 61.0 in | Wt 146.0 lb

## 2022-06-11 DIAGNOSIS — Z30431 Encounter for routine checking of intrauterine contraceptive device: Secondary | ICD-10-CM | POA: Diagnosis not present

## 2022-08-04 ENCOUNTER — Telehealth: Payer: Self-pay | Admitting: Allergy & Immunology

## 2022-08-04 MED ORDER — XHANCE 93 MCG/ACT NA EXHU: INHALANT_SUSPENSION | NASAL | 2 refills | Status: DC

## 2022-08-04 NOTE — Telephone Encounter (Signed)
Patient called and would like to have the [place] [facility] [address]. If price is higher give her a call. [phone]

## 2022-08-04 NOTE — Telephone Encounter (Signed)
Xhance prescription electronically sent to [facility]. Patient will need to notify the office if the cost to to expensive - the cost is not available when the prescription is sent.

## 2022-08-05 NOTE — Telephone Encounter (Signed)
PA has been submitted through CoverMyMeds for Xhance and is currently pending approval/denial.  

## 2022-08-12 ENCOUNTER — Telehealth: Payer: Self-pay | Admitting: Allergy & Immunology

## 2022-08-12 NOTE — Telephone Encounter (Signed)
Patient mom called and said that she needs new rx for omeproizole and singulair called [facility] [address] [phone]

## 2022-09-03 ENCOUNTER — Ambulatory Visit: Payer: BC Managed Care – PPO | Admitting: Allergy & Immunology

## 2022-09-03 DIAGNOSIS — H01004 Unspecified blepharitis left upper eyelid: Secondary | ICD-10-CM | POA: Diagnosis not present

## 2022-09-03 DIAGNOSIS — Z6828 Body mass index (BMI) 28.0-28.9, adult: Secondary | ICD-10-CM | POA: Diagnosis not present

## 2022-09-10 DIAGNOSIS — H01004 Unspecified blepharitis left upper eyelid: Secondary | ICD-10-CM | POA: Diagnosis not present

## 2022-09-10 DIAGNOSIS — Z23 Encounter for immunization: Secondary | ICD-10-CM | POA: Diagnosis not present

## 2022-09-17 ENCOUNTER — Ambulatory Visit (INDEPENDENT_AMBULATORY_CARE_PROVIDER_SITE_OTHER): Payer: BC Managed Care – PPO | Admitting: Allergy & Immunology

## 2022-09-17 ENCOUNTER — Encounter: Payer: Self-pay | Admitting: Allergy & Immunology

## 2022-09-17 VITALS — BP 124/78 | HR 82 | Temp 98.0°F | Resp 16 | Wt 148.4 lb

## 2022-09-17 DIAGNOSIS — J3089 Other allergic rhinitis: Secondary | ICD-10-CM | POA: Diagnosis not present

## 2022-09-17 DIAGNOSIS — T7800XD Anaphylactic reaction due to unspecified food, subsequent encounter: Secondary | ICD-10-CM

## 2022-09-17 DIAGNOSIS — J331 Polypoid sinus degeneration: Secondary | ICD-10-CM

## 2022-09-17 DIAGNOSIS — K219 Gastro-esophageal reflux disease without esophagitis: Secondary | ICD-10-CM

## 2022-09-17 DIAGNOSIS — J302 Other seasonal allergic rhinitis: Secondary | ICD-10-CM

## 2022-09-17 DIAGNOSIS — J4530 Mild persistent asthma, uncomplicated: Secondary | ICD-10-CM | POA: Diagnosis not present

## 2022-09-17 NOTE — Patient Instructions (Addendum)
1. Mild persistent asthma, uncomplicated - Lung testing looks great today. - Daily controller medication(s): Singulair '10mg'$  daily  - Prior to physical activity: Xopenex 2 puffs 10-15 minutes before physical activity. - Rescue medications: Xopenex 4 puffs every 4-6 hours as needed - Changes during respiratory infections or worsening symptoms: Add on Qvar 14mg 2 puffs twice daily for ONE TO TWO WEEKS. - Asthma control goals:  * Full participation in all desired activities (may need albuterol before activity) * Albuterol use two time or less a week on average (not counting use with activity) * Cough interfering with sleep two time or less a month * Oral steroids no more than once a year * No hospitalizations  2. Anaphylactic shock due to food (peanuts) - Continue to avoid peanuts. -[name](epinephrine) is up to date.   3. Allergic rhinitis - Continue with Xyzal and Allegra.  - Continue with Singulair '10mg'$  daily. - Continue with the [facility] as you are doing.   4. Return in about 6 months (around 03/18/2023).    Please inform [place] any Emergency Department visits, hospitalizations, or changes in symptoms. Call [place]abefore going to the ED for breathing or allergy symptoms since we might be able to fit you in for a sick visit. Feel free to contact u[place]anytime with any questions, problems, or concerns.  It was a pleasure to see you again today!  Websites that have reliable patient information: 1. American Academy of Asthma, Allergy, and Immunology: www.aaaai.org 2. Food Allergy Research and Education (FARE): foodallergy.org 3. Mothers of Asthmatics: http://www.asthmacommunitynetwork.org 4. American College of Allergy, Asthma, and Immunology: www.acaai.org   COVID-19 Vaccine Information can be found at: [email or web address] questions related to vaccine distribution or appointments, please email [email or web address] or call  [phone]     "Like" [facility] Facebook and Instagram for our latest updates!       Make sure you are registered to vote! If you have moved or changed any of your contact information, you will need to get this updated before voting!  In some cases, you MAY be able to register to vote online: [email or web address]

## 2022-09-17 NOTE — Progress Notes (Signed)
FOLLOW UP  Date of Service/Encounter:  09/17/22   Assessment:   Mild persistent asthma, uncomplicated   Anaphylactic shock due to food (peanuts)   Allergic rhinitis (grasses, weeds, trees, dust mites, dog) - s/p 5+ years of immunotherapy when she was in middle/high school   GERD - well controlled    Plan/Recommendations:   1. Mild persistent asthma, uncomplicated - Lung testing looks great today. - Daily controller medication(s): Singulair '10mg'$  daily  - Prior to physical activity: Xopenex 2 puffs 10-15 minutes before physical activity. - Rescue medications: Xopenex 4 puffs every 4-6 hours as needed - Changes during respiratory infections or worsening symptoms: Add on Qvar 61mg 2 puffs twice daily for ONE TO TWO WEEKS. - Asthma control goals:  * Full participation in all desired activities (may need albuterol before activity) * Albuterol use two time or less a week on average (not counting use with activity) * Cough interfering with sleep two time or less a month * Oral steroids no more than once a year * No hospitalizations  2. Anaphylactic shock due to food (peanuts) - Continue to avoid peanuts. -Stephanie Simpson(epinephrine) is up to date.   3. Allergic rhinitis - Continue with Xyzal and Allegra.  - Continue with Singulair '10mg'$  daily. - Continue with the [place] as you are doing.   4. Return in about 6 months (around 03/18/2023).    Subjective:   Stephanie Simpson is a 26y.o. female presenting today for follow up of  Chief Complaint  Patient presents with   Follow-up    Allergies are about the same. Other than that everything this is pretty much the same.    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  AFlorineis a 26y.o. female presenting for a follow up visit.  She was last seen in March 2023.  At that time, her lung testing looked excellent.  We continue with Singulair 10 mg daily and Xopenex as needed.  For her food allergies, she continue to avoid peanuts.  Epinephrine was up-to-date.  For allergic rhinitis, we continue with Xyzal as well as Singulair.  We [facility] the [facility] started Ryaltris 1 spray per nostril twice daily.  Since last visit, she reports that she has done very well.  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  She is hoping to stay in state just because it is closer to her parents and because and will be cheaper.  She continues to work at the [employer]   Asthma/Respiratory Symptom History: Asthma has been well controlled. She is doing very well. She has not needed her Qvar. She has not used her rescue inhaler in years.  She remains on the Singulair 10 mg daily.  She has not been using her rescue inhaler much at all.  She has not needed prednisone for her breathing in quite  some time.  Allergic Rhinitis Symptom History: She remains on her Xhance.  This seems to do quite well controlling her symptoms.  It is the best nasal spray that she has tried.  She also is on the [place].  She has been compliant with these.  She has not needed any antibiotics for any breakthrough sinus infections.  Food Allergy Symptom History: She continues to avoid peanuts in all forms.  She does need a new epinephrine autoinjector.  GERD Symptom History: She remains on  lansoprazole for her GERD.  This has been controlling her symptoms well without any problems.  She has a history of migraines and is on Emgality.  She also has Advil to use as needed.  Otherwise, there have been no changes to her past medical history, surgical history, family history, or social history.    Review of Systems  Constitutional: Negative.  Negative for chills, fever, malaise/fatigue and weight loss.  HENT: Negative.  Negative for congestion, ear discharge, ear pain and sinus pain.   Eyes:  Negative for pain, discharge and redness.  Respiratory:  Negative for cough, sputum production, shortness of breath and wheezing.   Cardiovascular: Negative.  Negative for chest pain and palpitations.  Gastrointestinal:  Negative for abdominal pain, constipation, diarrhea, heartburn, nausea and vomiting.  Skin: Negative.  Negative for itching and rash.  Neurological:  Negative for dizziness and headaches.  Endo/Heme/Allergies:  Negative for environmental allergies. Does not bruise/bleed easily.       Objective:   Blood pressure 124/78, pulse 82, temperature 98 F (36.7 C), temperature source Temporal, resp. rate 16, weight 148 lb 6.4 oz (67.3 kg), SpO2 99 %. Body mass index is 28.04 kg/m.    Physical Exam Vitals reviewed.  Constitutional:      Appearance: She is well-developed.  HENT:     Head: Normocephalic and atraumatic.     Right Ear: Tympanic membrane, ear canal and external ear normal.     Left Ear: Tympanic membrane, ear canal and external ear normal.     Nose: No nasal deformity, septal deviation, mucosal edema or rhinorrhea.     Right Turbinates: Enlarged, swollen and pale.     Left Turbinates: Enlarged, swollen and pale.     Right Sinus: No maxillary sinus tenderness or frontal sinus tenderness.     Left Sinus: No maxillary sinus tenderness or frontal sinus tenderness.     Comments: Polypoid sinus degeneration noted bilaterally.     Mouth/Throat:     Mouth: Mucous  membranes are not pale and not dry.     Pharynx: Uvula midline.  Eyes:     General: Lids are normal. No allergic shiner.       Right eye: No discharge.        Left eye: No discharge.     Conjunctiva/sclera: Conjunctivae normal.     Right eye: Right conjunctiva is not injected. No chemosis.    Left eye: Left conjunctiva is not injected. No chemosis.    Pupils: Pupils are equal, round, and reactive to light.  Cardiovascular:     Rate and Rhythm: Normal rate and regular rhythm.     Heart sounds: Normal heart sounds.  Pulmonary:     Effort: Pulmonary effort is normal. No tachypnea, accessory muscle usage or respiratory distress.     Breath sounds: Normal breath sounds. No wheezing, rhonchi or rales.     Comments: Moving air well in all lung fields. Chest:     Chest wall:  No tenderness.  Lymphadenopathy:     Cervical: No cervical adenopathy.  Skin:    Coloration: Skin is not pale.     Findings: No abrasion, erythema, petechiae or rash. Rash is not papular, urticarial or vesicular.  Neurological:     Mental Status: She is alert.  Psychiatric:        Behavior: Behavior is cooperative.      Diagnostic studies:    Spirometry: results normal (FEV1: 2.58/106%, FVC: 3.09/111%, FEV1/FVC: 83%).    Spirometry consistent with normal pattern. Xopenex four puffs via MDI treatment given in clinic with she has not.  Allergy Studies: none        Salvatore Marvel, MD  [facility]

## 2022-09-18 ENCOUNTER — Telehealth: Payer: Self-pay | Admitting: *Deleted

## 2022-09-18 MED ORDER — XHANCE 93 MCG/ACT NA EXHU
INHALANT_SUSPENSION | NASAL | 2 refills | Status: DC
Start: 2022-09-18 — End: 2023-01-19

## 2022-09-18 MED ORDER — LEVALBUTEROL TARTRATE 45 MCG/ACT IN AERO: 2.0000 | INHALATION_SPRAY | Freq: Four times a day (QID) | RESPIRATORY_TRACT | 1 refills | Status: DC | PRN

## 2022-09-18 MED ORDER — MONTELUKAST SODIUM 10 MG PO TABS: 10.0000 mg | ORAL_TABLET | Freq: Every day | ORAL | 5 refills | Status: DC

## 2022-09-18 MED ORDER — OMEPRAZOLE 20 MG PO CPDR: 20.0000 mg | DELAYED_RELEASE_CAPSULE | Freq: Every day | ORAL | 5 refills | Status: DC

## 2022-09-18 MED ORDER — QVAR REDIHALER 80 MCG/ACT IN AERB
INHALATION_SPRAY | RESPIRATORY_TRACT | 5 refills | Status: DC
Start: 2022-09-18 — End: 2023-10-07

## 2022-09-18 NOTE — Telephone Encounter (Signed)
PA has been submitted through CoverMyMeds for Levalbuterol HFA and is currently pending approval/denial.

## 2022-09-21 NOTE — Telephone Encounter (Signed)
PA has been approved for Levalbuterol HFA and sent electronically to pharmacy. PA is good for one year.

## 2022-09-22 MED ORDER — LEVOCETIRIZINE DIHYDROCHLORIDE 5 MG PO TABS: 5.0000 mg | ORAL_TABLET | Freq: Every evening | ORAL | 2 refills | Status: DC

## 2022-09-22 MED ORDER — EPINEPHRINE 0.3 MG/0.3ML IJ SOAJ
INTRAMUSCULAR | 2 refills | Status: DC
Start: 2022-09-22 — End: 2023-04-01

## 2022-10-20 DIAGNOSIS — R0981 Nasal congestion: Secondary | ICD-10-CM | POA: Diagnosis not present

## 2022-10-20 DIAGNOSIS — J029 Acute pharyngitis, unspecified: Secondary | ICD-10-CM | POA: Diagnosis not present

## 2022-10-20 DIAGNOSIS — Z03818 Encounter for observation for suspected exposure to other biological agents ruled out: Secondary | ICD-10-CM | POA: Diagnosis not present

## 2022-10-29 DIAGNOSIS — D649 Anemia, unspecified: Secondary | ICD-10-CM | POA: Diagnosis not present

## 2022-10-29 DIAGNOSIS — R5383 Other fatigue: Secondary | ICD-10-CM | POA: Diagnosis not present

## 2022-10-29 DIAGNOSIS — L650 Telogen effluvium: Secondary | ICD-10-CM | POA: Diagnosis not present

## 2022-10-29 DIAGNOSIS — L700 Acne vulgaris: Secondary | ICD-10-CM | POA: Diagnosis not present

## 2022-12-07 DIAGNOSIS — G43009 Migraine without aura, not intractable, without status migrainosus: Secondary | ICD-10-CM | POA: Diagnosis not present

## 2022-12-07 DIAGNOSIS — J45909 Unspecified asthma, uncomplicated: Secondary | ICD-10-CM | POA: Diagnosis not present

## 2022-12-07 DIAGNOSIS — F909 Attention-deficit hyperactivity disorder, unspecified type: Secondary | ICD-10-CM | POA: Diagnosis not present

## 2022-12-07 DIAGNOSIS — Z6828 Body mass index (BMI) 28.0-28.9, adult: Secondary | ICD-10-CM | POA: Diagnosis not present

## 2022-12-25 DIAGNOSIS — R509 Fever, unspecified: Secondary | ICD-10-CM | POA: Diagnosis not present

## 2022-12-25 DIAGNOSIS — Z03818 Encounter for observation for suspected exposure to other biological agents ruled out: Secondary | ICD-10-CM | POA: Diagnosis not present

## 2022-12-25 DIAGNOSIS — J029 Acute pharyngitis, unspecified: Secondary | ICD-10-CM | POA: Diagnosis not present

## 2023-01-18 ENCOUNTER — Other Ambulatory Visit: Payer: Self-pay | Admitting: Allergy & Immunology

## 2023-02-09 NOTE — Progress Notes (Unsigned)
Referring:  Marda Stalker, [place],  [place] [id number]  PCP: Marda Stalker, PA-C  Neurology was asked to evaluate Stephanie Simpson, a 27 year old female for a chief complaint of headaches.  Our recommendations of care will be communicated by shared medical record.    CC:  headaches  History provided from self  HPI:  Medical co-morbidities: asthma, GERD  The patient presents for evaluation of headaches which began several years ago. She previously followed with Dr. Ninfa Meeker in Neurology for her migraines, and presents today to establish care. Her migraines are associated with visual aura (flashes of lights in vision), photophobia, and phonophobia. They last 1-2 days at a time. Prior to her previous neurologist's departure, she was taking Zonisamide and Emgality for migraine prevention. This controlled her migraines, but she struggled with the injections due to needle anxiety and pain. She previously took [place] for rescue which was effective. She has been out of all of her migraine medications for the past 2-3 months and has noticed a worsening of her migraines. She currently averages 2-3 migraines per week.  Headache History: Onset: several years ago Aura: flashes of lights in her vision Associated Symptoms:  Photophobia: yes  Phonophobia: yes  Nausea: no Worse with activity?: yes Duration of headaches: 1-2 days  Migraine days per month: 12 Headache free days per month: 18  Current Treatment: Abortive none  Preventative none  Prior Therapies                                 Prevention: Topamax 100 mg daily Zonisamide 300 mg QHS Atenolol 25 mg QHS Imipramine Emgality 120 mg monthly  Rescue: BC Powder Fioricet Flexeril Frovatriptan 2.5 mg PRN Rizatriptan 10 mg PRN Ubrelvy 100 mg PRN   LABS: CBC    Component Value Date/Time   WBC 6.8 08/14/2019 1414   RBC 4.71 08/14/2019 1414   HGB 14.7 08/14/2019 1414   HGB 11.9 (L) 03/02/2017 1341    HCT 43.9 08/14/2019 1414   PLT 359 08/14/2019 1414   MCV 93 08/14/2019 1414   MCH 31.2 08/14/2019 1414   MCHC 33.5 08/14/2019 1414   RDW 12.8 08/14/2019 1414      Latest Ref Rng & Units 08/14/2019    2:14 PM 08/10/2018    2:44 PM  CMP  Glucose 65 - 99 mg/dL 77  69   BUN 6 - 20 mg/dL 13  13   Creatinine 0.57 - 1.00 mg/dL 0.97  0.79   Sodium 134 - 144 mmol/L 139  143   Potassium 3.5 - 5.2 mmol/L 4.3  4.2   Chloride 96 - 106 mmol/L 107  108   CO2 20 - 29 mmol/L 21  23   Calcium 8.7 - 10.2 mg/dL 9.2  9.3   Total Protein 6.0 - 8.5 g/dL 6.8  6.6   Total Bilirubin 0.0 - 1.2 mg/dL 0.2  0.3   Alkaline Phos 39 - 117 IU/L 113  105   AST 0 - 40 IU/L 22  18   ALT 0 - 32 IU/L 26  14      IMAGING:  N/a   Current Outpatient Medications on File Prior to Visit  Medication Sig Dispense Refill   beclomethasone (QVAR REDIHALER) 80 MCG/ACT inhaler 1 puff 1 each 5   EPINEPHrine (AUVI-Q) 0.3 mg/0.3 mL IJ SOAJ injection Use as directed for severe allergic reaction 2 each 2  fexofenadine (ALLEGRA) 180 MG tablet Take 180 mg by mouth daily.     Galcanezumab-gnlm (EMGALITY) 120 MG/ML SOSY 1 ml     ibuprofen (ADVIL) 800 MG tablet Take 1 tablet (800 mg total) by mouth every 8 (eight) hours as needed. 100 tablet 2   lansoprazole (PREVACID) 30 MG capsule 1 capsule daily for reflux. 90 capsule 3   levalbuterol (XOPENEX HFA) 45 MCG/ACT inhaler Inhale 2 puffs into the lungs every 6 (six) hours as needed for wheezing. 1 each 1   Levonorgestrel 19.5 MG IUD by Intrauterine route.     methylphenidate 27 MG PO CR tablet 1 tablet in the morning     montelukast (SINGULAIR) 10 MG tablet Take 1 tablet (10 mg total) by mouth at bedtime. 30 tablet 5   omeprazole (PRILOSEC) 20 MG capsule Take 1 capsule (20 mg total) by mouth daily. 30 capsule 5   spironolactone (ALDACTONE) 50 MG tablet Take 50 mg by mouth daily.     XHANCE 93 MCG/ACT EXHU BLOW 2 DOSES IN EACH NOSTRIL TWICE DAILY 48 mL 1   levocetirizine (XYZAL) 5  MG tablet Take 1 tablet (5 mg total) by mouth every evening. 90 tablet 2   No current facility-administered medications on file prior to visit.     Allergies: Allergies  Allergen Reactions   Amoxicillin-Pot Clavulanate Hives and Other (See Comments)    No reaction noted   Azithromycin Hives   Doxycycline Hives and Other (See Comments)    HIVES   Peanut Oil Other (See Comments)    No reaction noted Unsure, told she was allergic per allergist   Peanut-Containing Drug Products     Family History: Migraine or other headaches in the family:  grandmother Aneurysms in a first degree relative:  no Brain tumors in the family:  no Other neurological illness in the family:   no  Past Medical History: Past Medical History:  Diagnosis Date   ADD (attention deficit disorder) 2013   Asthma    Dysmenorrhea    Migraines    with aura   Thrombocytosis 2018   394,000 on 05/31/17    Past Surgical History Past Surgical History:  Procedure Laterality Date   ADENOIDECTOMY     OTHER SURGICAL HISTORY     hematoma surgery on head as an infant   TONSILLECTOMY AND ADENOIDECTOMY     WISDOM TOOTH EXTRACTION      Social History: Social History   Tobacco Use   Smoking status: Never   Smokeless tobacco: Never  Vaping Use   Vaping Use: Never used  Substance Use Topics   Alcohol use: Not Currently    Comment: 1-2 drinks/month   Drug use: Never    ROS: Negative for fevers, chills. Positive for headaches. All other systems reviewed and negative unless stated otherwise in HPI.   Physical Exam:   Vital Signs: BP 131/85 (BP Location: Left Arm, Patient Position: Sitting, Cuff Size: Normal)   Pulse 88   Ht 5' (1.524 m)   Wt 151 lb 9.6 oz (68.8 kg)   BMI 29.61 kg/m  GENERAL: well appearing,in no acute distress,alert SKIN:  Color, texture, turgor normal. No rashes or lesions HEAD:  Normocephalic/atraumatic. CV:  RRR RESP: Normal respiratory effort MSK: no tenderness to palpation over  occiput, neck, or shoulders  NEUROLOGICAL: Mental Status: Alert, oriented to person, place and time,Follows commands Cranial Nerves: PERRL, visual fields intact to confrontation, extraocular movements intact, facial sensation intact, no facial droop or ptosis, hearing grossly intact,  no dysarthria Motor: muscle strength 5/5 both upper and lower extremities,no drift, normal tone Reflexes: 2+ throughout Sensation: intact to light touch all 4 extremities Coordination: Finger-to- nose-finger intact bilaterally Gait: normal-based   IMPRESSION: 27 year old female with a history of asthma, GERD who presents to establish care for migraines. She has had a worsening in headaches since running out of medications 2-3 months ago. Will restart Zonisamide and Ubrelvy. She would prefer to switch Emgality to a non-injectable option due to pain and anxiety associated with injection. Will start Qulipta for migraine prevention.  PLAN: -Prevention: Start Qulipta 60 mg daily. Restart Zonisamide 300 mg daily -Rescue: Restart Ubrelvy 100 mg PRN   I spent a total of 20 minutes chart reviewing and counseling the patient. Headache education was done. Discussed treatment options including preventive and acute medications. Discussed medication side effects, adverse reactions and drug interactions. Written educational materials and patient instructions outlining all of the above were given.  Follow-up: 6 months   Genia Harold, MD 02/10/2023   8:45 AM

## 2023-02-10 ENCOUNTER — Encounter: Payer: Self-pay | Admitting: Psychiatry

## 2023-02-10 ENCOUNTER — Ambulatory Visit: Payer: BC Managed Care – PPO | Admitting: Psychiatry

## 2023-02-10 VITALS — BP 131/85 | HR 88 | Ht 60.0 in | Wt 151.6 lb

## 2023-02-10 DIAGNOSIS — G43119 Migraine with aura, intractable, without status migrainosus: Secondary | ICD-10-CM

## 2023-02-10 MED ORDER — ZONISAMIDE 100 MG PO CAPS: 300.0000 mg | ORAL_CAPSULE | Freq: Every day | ORAL | 6 refills | Status: DC

## 2023-02-10 MED ORDER — QULIPTA 60 MG PO TABS: 60.0000 mg | ORAL_TABLET | Freq: Every day | ORAL | 6 refills | Status: DC

## 2023-02-10 MED ORDER — UBROGEPANT 100 MG PO TABS: 100.0000 mg | ORAL_TABLET | ORAL | 6 refills | Status: DC | PRN

## 2023-02-10 NOTE — Patient Instructions (Signed)
Start Qulipta daily for headache prevention. Restart Zonisamide and Roselyn Meier

## 2023-03-01 DIAGNOSIS — H04123 Dry eye syndrome of bilateral lacrimal glands: Secondary | ICD-10-CM | POA: Diagnosis not present

## 2023-03-01 DIAGNOSIS — D485 Neoplasm of uncertain behavior of skin: Secondary | ICD-10-CM | POA: Diagnosis not present

## 2023-03-01 DIAGNOSIS — H5213 Myopia, bilateral: Secondary | ICD-10-CM | POA: Diagnosis not present

## 2023-03-01 DIAGNOSIS — H31091 Other chorioretinal scars, right eye: Secondary | ICD-10-CM | POA: Diagnosis not present

## 2023-03-25 ENCOUNTER — Ambulatory Visit: Payer: BC Managed Care – PPO | Admitting: Allergy & Immunology

## 2023-04-01 ENCOUNTER — Other Ambulatory Visit: Payer: Self-pay

## 2023-04-01 ENCOUNTER — Encounter: Payer: Self-pay | Admitting: Allergy & Immunology

## 2023-04-01 ENCOUNTER — Ambulatory Visit: Payer: BC Managed Care – PPO | Admitting: Allergy & Immunology

## 2023-04-01 VITALS — BP 104/74 | HR 86 | Temp 98.1°F | Ht 60.0 in | Wt 148.7 lb

## 2023-04-01 DIAGNOSIS — J3089 Other allergic rhinitis: Secondary | ICD-10-CM

## 2023-04-01 DIAGNOSIS — T7800XD Anaphylactic reaction due to unspecified food, subsequent encounter: Secondary | ICD-10-CM | POA: Diagnosis not present

## 2023-04-01 DIAGNOSIS — J302 Other seasonal allergic rhinitis: Secondary | ICD-10-CM

## 2023-04-01 DIAGNOSIS — K219 Gastro-esophageal reflux disease without esophagitis: Secondary | ICD-10-CM

## 2023-04-01 DIAGNOSIS — J4530 Mild persistent asthma, uncomplicated: Secondary | ICD-10-CM | POA: Diagnosis not present

## 2023-04-01 MED ORDER — FEXOFENADINE HCL 180 MG PO TABS: 180.0000 mg | ORAL_TABLET | Freq: Every day | ORAL | 5 refills | Status: DC

## 2023-04-01 MED ORDER — AZELASTINE HCL 0.1 % NA SOLN: 2.0000 | Freq: Two times a day (BID) | NASAL | 5 refills | Status: DC

## 2023-04-01 MED ORDER — EPINEPHRINE 0.3 MG/0.3ML IJ SOAJ: INTRAMUSCULAR | 2 refills | Status: DC

## 2023-04-01 MED ORDER — MONTELUKAST SODIUM 10 MG PO TABS: 10.0000 mg | ORAL_TABLET | Freq: Every day | ORAL | 5 refills | Status: DC

## 2023-04-01 MED ORDER — OMEPRAZOLE 20 MG PO CPDR: 20.0000 mg | DELAYED_RELEASE_CAPSULE | Freq: Every day | ORAL | 5 refills | Status: DC

## 2023-04-01 NOTE — Progress Notes (Signed)
FOLLOW UP  Date of Service/Encounter:  04/01/23   Assessment:   Mild persistent asthma, uncomplicated   Anaphylactic shock due to food (peanuts) - doing fine with avoidance   Allergic rhinitis (grasses, weeds, trees, dust mites, dog) - s/p 5+ years of immunotherapy when she was in [employer] school, but with worsening symptoms currently    GERD - well controlled  Plan/Recommendations:   1. Mild persistent asthma, uncomplicated - Lung testing looks great today. - We are not going to make any medication changes at this time. - Daily controller medication(s): Singulair 10mg  daily  - Prior to physical activity: Xopenex 2 puffs 10-15 minutes before physical activity. - Rescue medications: Xopenex 4 puffs every 4-6 hours as needed - Changes during respiratory infections or worsening symptoms: Add on Qvar 68mcg 2 puffs twice daily for ONE TO TWO WEEKS. - Asthma control goals:  * Full participation in all desired activities (may need albuterol before activity) * Albuterol use two time or less a week on average (not counting use with activity) * Cough interfering with sleep two time or less a month * Oral steroids no more than once a year * No hospitalizations  2. Anaphylactic shock due to food (peanuts) - Continue to avoid peanuts. [name] (epinephrine) Simpson up to date.   3. Allergic rhinitis - Continue with Xyzal and Allegra.  - Continue with Singulair 10mg  daily. - Continue with the Xhance daily as you are doing.  - Add on Astelin two sprays per nostril twice daily.  4. Return in about 6 months (around 10/01/2023).    Subjective:   Stephanie Simpson a 27 y.o. 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  Stephanie Simpson Simpson a 27 y.o. female presenting for a follow up visit.  She was last seen in September 2023.  At that time, lung testing looked great.  We continue with Singulair 10 mg daily as well as Xopenex as needed.  She has Qvar that she has during flares.  She continue to avoid peanuts.  Her epinephrine autoinjector was up-to-date.  She continued on Xyzal as well as Singulair and Xhance.  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  Since last visit, she has done well. She Simpson reading a book that she does not really like it. She Simpson going to finish it anyway.  Apparently, this Simpson a source of pride for her.  She never leaves a book that she started on read.  Her application Simpson not due until September. She Simpson still working on the essay. She has some supplemental applications that open up in May 2024. She really liked [employer]. She also looked at [employer] while she was out there. She talked to a lot of people in  Admissions and they ended up interviewing her.  Evidently, there are 300 people out applied to the right school and 100 of the students get in, so she feels like she has a pretty good chance of acceptance.  Asthma/Respiratory Symptom History: She Simpson doing Singulair. She does feel bad when she misses. She has not used her rescue inhaler. She has not needed prednisone at all for her symptoms.  Stephanie Simpson's asthma has been well controlled. She has not required rescue medication, experienced nocturnal awakenings due to lower  respiratory symptoms, nor have activities of daily living been limited. She has required no Emergency Department or Urgent Care visits for her asthma. She has required zero courses of systemic steroids for asthma exacerbations since the last visit. ACT score today Simpson 23, indicating excellent asthma symptom control.   Allergic Rhinitis Symptom History: Environmental allergies are not well controlled. The pollen Simpson terrible right now. She has the Basye as well as Allegra and Xyzal at night. It Simpson just a bad time right now.  This Simpson a particularly bad time of year for her.  She really Simpson not interested in doing allergy shots again.  She did 5 years and then when she was in middle school and high school and Simpson not really interested in trying these again.  She Simpson open to it if she needs it, but she thinks that she just gets through this current flare she will be fine.  Food Allergy Symptom History: She continues to avoid peanuts.  There have not been any accidental exposures.  She does need a new EpiPen.  GERD Symptom History: She remains on the omeprazole daily.  This Simpson controlling her symptoms very effectively.  Otherwise, there have been no changes to her past medical history, surgical history, family history, or social history.    Review of Systems  Constitutional: Negative.  Negative for chills, fever, malaise/fatigue and weight loss.  HENT: Negative.  Negative for congestion, ear discharge, ear pain and sinus pain.   Eyes:  Negative for pain, discharge and redness.  Respiratory:  Negative for cough, sputum production, shortness of breath, wheezing and stridor.   Cardiovascular: Negative.  Negative for chest pain and palpitations.  Gastrointestinal:  Negative for abdominal pain, constipation, diarrhea, heartburn, nausea and vomiting.  Skin: Negative.  Negative for itching and rash.  Neurological:  Negative for dizziness and headaches.  Endo/Heme/Allergies:  Positive for environmental  allergies. Does not bruise/bleed easily.       Objective:   Blood pressure 104/74, pulse 86, temperature 98.1 F (36.7 C), height 5' (1.524 m), weight 148 lb 11.2 oz (67.4 kg), SpO2 98 %. Body mass index Simpson 29.04 kg/m.    Physical Exam Vitals reviewed.  Constitutional:      Appearance: She Simpson well-developed.  HENT:     Head: Normocephalic and atraumatic.     Right Ear: Tympanic membrane, ear canal and external ear normal.     Left Ear: Tympanic membrane, ear canal and external ear normal.     Nose: No nasal deformity, septal deviation, mucosal edema or rhinorrhea.     Right Turbinates: Enlarged, swollen and pale.     Left Turbinates: Enlarged, swollen and pale.     Right Sinus: No maxillary sinus tenderness or frontal sinus tenderness.     Left Sinus: No maxillary sinus tenderness or frontal sinus tenderness.     Comments: Polypoid sinus degeneration noted bilaterally.     Mouth/Throat:     Mouth: Mucous membranes are not  pale and not dry.     Pharynx: Uvula midline.  Eyes:     General: Lids are normal. Allergic shiner present.        Right eye: No discharge.        Left eye: No discharge.     Conjunctiva/sclera: Conjunctivae normal.     Right eye: Right conjunctiva Simpson not injected. No chemosis.    Left eye: Left conjunctiva Simpson not injected. No chemosis.    Pupils: Pupils are equal, round, and reactive to light.  Cardiovascular:     Rate and Rhythm: Normal rate and regular rhythm.     Heart sounds: Normal heart sounds.  Pulmonary:     Effort: Pulmonary effort Simpson normal. No tachypnea, accessory muscle usage or respiratory distress.     Breath sounds: Normal breath sounds. No wheezing, rhonchi or rales.     Comments: Moving air well in all lung fields. Chest:     Chest wall: No tenderness.  Lymphadenopathy:     Cervical: No cervical adenopathy.  Skin:    Coloration: Skin Simpson not pale.     Findings: No abrasion, erythema, petechiae or rash. Rash Simpson not papular,  urticarial or vesicular.  Neurological:     Mental Status: She Simpson alert.  Psychiatric:        Behavior: Behavior Simpson cooperative.      Diagnostic studies: none      Salvatore Marvel, MD  [facility]

## 2023-04-01 NOTE — Patient Instructions (Addendum)
1. Mild persistent asthma, uncomplicated - Lung testing looks great today. - We are not going to make any medication changes at this time. - Daily controller medication(s): Singulair 10mg  daily  - Prior to physical activity: Xopenex 2 puffs 10-15 minutes before physical activity. - Rescue medications: Xopenex 4 puffs every 4-6 hours as needed - Changes during respiratory infections or worsening symptoms: Add on Qvar 12mcg 2 puffs twice daily for ONE TO TWO WEEKS. - Asthma control goals:  * Full participation in all desired activities (may need albuterol before activity) * Albuterol use two time or less a week on average (not counting use with activity) * Cough interfering with sleep two time or less a month * Oral steroids no more than once a year * No hospitalizations  2. Anaphylactic shock due to food (peanuts) - Continue to avoid peanuts. [name] (epinephrine) is up to date.   3. Allergic rhinitis - Continue with Xyzal and Allegra.  - Continue with Singulair 10mg  daily. - Continue with the Xhance daily as you are doing.  - Add on Astelin two sprays per nostril twice daily.  4. Return in about 6 months (around 10/01/2023).    Please inform us of any Emergency Department visits, hospitalizations, or changes in symptoms. Call us before going to the ED for breathing or allergy symptoms since we might be able to fit you in for a sick visit. Feel free to contact us anytime with any questions, problems, or concerns.  It was a pleasure to see you again today!  Websites that have reliable patient information: 1. American Academy of Asthma, Allergy, and Immunology: www.aaaai.org 2. Food Allergy Research and Education (FARE): foodallergy.org 3. Mothers of Asthmatics: http://www.asthmacommunitynetwork.org 4. American College of Allergy, Asthma, and Immunology: www.acaai.org   COVID-19 Vaccine Information can be found at:  [email or web address] For questions related to vaccine distribution or appointments, please email [email or web address] or call [phone].     "Like" [place] on Facebook and Instagram for our latest updates!       Make sure you are registered to vote! If you have moved or changed any of your contact information, you will need to get this updated before voting!  In some cases, you MAY be able to register to vote online: [email or web address]

## 2023-04-18 ENCOUNTER — Other Ambulatory Visit: Payer: Self-pay | Admitting: Allergy & Immunology

## 2023-04-19 ENCOUNTER — Other Ambulatory Visit: Payer: Self-pay | Admitting: Allergy & Immunology

## 2023-04-19 NOTE — Telephone Encounter (Signed)
Pa for xhance please and thank you

## 2023-04-20 ENCOUNTER — Other Ambulatory Visit: Payer: Self-pay | Admitting: Allergy & Immunology

## 2023-04-20 ENCOUNTER — Other Ambulatory Visit (HOSPITAL_COMMUNITY): Payer: Self-pay

## 2023-04-20 NOTE — Telephone Encounter (Signed)
This shows that a quantity of 45 mL for a 45 day supply was filled on 03-31-23

## 2023-04-22 NOTE — Telephone Encounter (Signed)
Please sign off. Filled 03-31-23 for 48 mL (45 day supply)

## 2023-05-18 ENCOUNTER — Other Ambulatory Visit (HOSPITAL_COMMUNITY): Payer: Self-pay

## 2023-05-25 ENCOUNTER — Other Ambulatory Visit (HOSPITAL_COMMUNITY): Payer: Self-pay

## 2023-05-29 ENCOUNTER — Telehealth: Payer: Self-pay

## 2023-05-29 NOTE — Telephone Encounter (Signed)
Pharmacy Patient Advocate Encounter  Prior Authorization for Ubrelvy 100MG  tablets has been APPROVED by [facility] from N/A to N/A.   PA # PA Case ID: [id number]

## 2023-05-29 NOTE — Telephone Encounter (Signed)
Pharmacy Patient Advocate Encounter   Received notification from [facility] that prior authorization for Ubrelvy 100MG  tablets is required/requested.   PA submitted to [facility] via CoverMyMeds Key or (Medicaid) confirmation # [id number] Status is pending

## 2023-06-21 DIAGNOSIS — F909 Attention-deficit hyperactivity disorder, unspecified type: Secondary | ICD-10-CM | POA: Diagnosis not present

## 2023-06-21 DIAGNOSIS — G43009 Migraine without aura, not intractable, without status migrainosus: Secondary | ICD-10-CM | POA: Diagnosis not present

## 2023-06-21 DIAGNOSIS — J45909 Unspecified asthma, uncomplicated: Secondary | ICD-10-CM | POA: Diagnosis not present

## 2023-07-30 ENCOUNTER — Other Ambulatory Visit: Payer: Self-pay | Admitting: Allergy & Immunology

## 2023-07-30 ENCOUNTER — Other Ambulatory Visit: Payer: Self-pay | Admitting: Psychiatry

## 2023-08-10 ENCOUNTER — Telehealth: Payer: Self-pay | Admitting: Psychiatry

## 2023-08-10 NOTE — Telephone Encounter (Signed)
Pt states regarding her Atogepant (QULIPTA) 60 MG TABS she has never paid anything for this.  Pt now being told she would have to pay $491.00 pt is unsure if a coupon was previously applied or not but she is asking for a call for assistance.

## 2023-08-10 NOTE — Telephone Encounter (Signed)
Called pt and she stated that she went to the pharmacy and they told her that her Bennie Pierini wasn't covered anymore under her insurance. Pt is okay with trying something else if she is unable to get her Qulipta.

## 2023-08-12 ENCOUNTER — Telehealth: Payer: Self-pay

## 2023-08-12 ENCOUNTER — Other Ambulatory Visit (HOSPITAL_COMMUNITY): Payer: Self-pay

## 2023-08-12 NOTE — Telephone Encounter (Signed)
Pharmacy Patient Advocate Encounter   Received notification from CoverMyMeds that prior authorization for Ubrelvy 100MG  tablets is required/requested.   Insurance verification completed.   The patient is insured through [facility] .   Per test claim: PA required; PA started via CoverMyMeds. KEY [id number] . Waiting for clinical questions to populate.

## 2023-08-13 ENCOUNTER — Telehealth: Payer: Self-pay

## 2023-08-13 ENCOUNTER — Other Ambulatory Visit (HOSPITAL_COMMUNITY): Payer: Self-pay

## 2023-08-13 NOTE — Telephone Encounter (Signed)
 Pharmacy Patient Advocate Encounter  Questions generated, answered, and submitted

## 2023-08-13 NOTE — Telephone Encounter (Signed)
Pharmacy Patient Advocate Encounter   Received notification from Pt Calls Messages that prior authorization for Qulipta 60MG  tablets is required/requested.   Insurance verification completed.   The patient is insured through [facility] .   Per test claim: PA required; PA started via CoverMyMeds. KEY [id number] . Waiting for clinical questions to populate.

## 2023-08-13 NOTE — Telephone Encounter (Signed)
PA request has been Submitted and is waiting for clinical questions to generate. New Encounter created for follow up. For additional info see Pharmacy Prior Auth telephone encounter from 08-13-2023.

## 2023-08-15 ENCOUNTER — Other Ambulatory Visit: Payer: Self-pay | Admitting: Psychiatry

## 2023-08-15 ENCOUNTER — Other Ambulatory Visit: Payer: Self-pay | Admitting: Allergy & Immunology

## 2023-08-17 NOTE — Telephone Encounter (Signed)
Last seen on 02/10/23 per note "Will restart Zonisamide and Ubrelvy. " Follow up scheduled on 09/20/23

## 2023-08-18 NOTE — Telephone Encounter (Signed)
Pharmacy Patient Advocate Encounter  Received notification from [facility] that Prior Authorization for Bernita Raisin has been APPROVED from 08/13/2023 to 08/12/2024

## 2023-08-20 NOTE — Telephone Encounter (Signed)
Clinical questions have been submitted-awaiting determination. 

## 2023-08-23 ENCOUNTER — Other Ambulatory Visit (HOSPITAL_COMMUNITY): Payer: Self-pay

## 2023-08-23 MED ORDER — NURTEC 75 MG PO TBDP
75.0000 mg | ORAL_TABLET | ORAL | 6 refills | Status: DC
Start: 2023-08-23 — End: 2024-01-28

## 2023-08-23 NOTE — Telephone Encounter (Signed)
Pharmacy Patient Advocate Encounter  Received notification from [facility] that Prior Authorization for Qulipta 60MG  tablets has been DENIED. Please advise how you'd like to proceed. Full denial letter will be uploaded to the media tab. See denial reason below.   PA #/Case ID/Reference #: [id number]

## 2023-08-23 NOTE — Telephone Encounter (Signed)
Called and relayed message to patient who was agreeable to plan and states she will let us know if it does not work for her

## 2023-08-23 NOTE — Telephone Encounter (Signed)
I sent an rx for Nurtec to her pharmacy. She can take this every other day for migraine prevention. This medication is similar to [place], so she should stop [place].

## 2023-08-23 NOTE — Addendum Note (Signed)
Addended by: Ocie Doyne on: 08/23/2023 04:18 PM   Modules accepted: Orders

## 2023-08-24 NOTE — Telephone Encounter (Signed)
Error

## 2023-09-05 DIAGNOSIS — J0141 Acute recurrent pansinusitis: Secondary | ICD-10-CM | POA: Diagnosis not present

## 2023-09-06 ENCOUNTER — Telehealth: Payer: Self-pay

## 2023-09-06 NOTE — Telephone Encounter (Signed)
*  [facility]  Pharmacy Patient Advocate Encounter   Received notification from CoverMyMeds that prior authorization for Nurtec 75MG  dispersible tablets  is required/requested.   Insurance verification completed.   The patient is insured through [facility] .   Per test claim: PA required; PA submitted to [facility] via CoverMyMeds Key/confirmation #/EOC [id number] Status is pending

## 2023-09-13 ENCOUNTER — Other Ambulatory Visit: Payer: Self-pay | Admitting: Allergy & Immunology

## 2023-09-13 NOTE — Telephone Encounter (Signed)
Pharmacy Patient Advocate Encounter  Received notification from [facility] that Prior Authorization for Nurtec 75MG  dispersible tablets has been APPROVED from 09/08/2023 to 11/30/2023.   PA #/Case ID/Reference #: PA Case ID #: [id number]

## 2023-09-20 ENCOUNTER — Ambulatory Visit: Payer: BC Managed Care – PPO | Admitting: Psychiatry

## 2023-09-20 ENCOUNTER — Encounter: Payer: Self-pay | Admitting: Psychiatry

## 2023-09-20 VITALS — BP 132/93 | HR 85 | Ht 60.0 in | Wt 151.0 lb

## 2023-09-20 DIAGNOSIS — G43119 Migraine with aura, intractable, without status migrainosus: Secondary | ICD-10-CM

## 2023-09-20 NOTE — Patient Instructions (Signed)
You can either take Nurtec as needed (take at onset of migraine), or you can take it every other day to prevent migraines. Please do not take Ubrelvy while taking this medication

## 2023-09-20 NOTE — Progress Notes (Signed)
   CC:  headaches  Follow-up Visit  Last visit: 02/10/23  Brief HPI: 27 year old female with a history of asthma and GERD who follows in clinic for chronic migraines with aura.  At her last visit she was switched from [facility] to [place]. Zonisamide and Bernita Raisin were continued.   Interval History: Bennie Pierini worked well for her, however her insurance stopped covering it. She was switched to Nurtec every other day for prevention. She just picked this up and has not started it yet. Currently she is averaging 2-3 migraines per month. Has been taking Ubrelvy for rescue as she has not yet started Nurtec.   Migraine days per month: 3 Headache free days per month: 26  Current Headache Regimen: Preventative: Nurtec 75 mg every other day Abortive: Nurtec 75 mg   Prior Therapies                                  Prevention: Topamax 100 mg daily Zonisamide 300 mg QHS Atenolol 25 mg QHS Imipramine Emgality 120 mg monthly - helped but would prefer not to inject herself [place] - helped but insurance stopped covering Nurtec 75 mg every other day   Rescue: BC Powder Fioricet Flexeril Frovatriptan 2.5 mg PRN Rizatriptan 10 mg PRN Ubrelvy 100 mg PRN - helpful Nurtec 75 mg PRN  Physical Exam:   Vital Signs: BP (!) 132/93 (BP Location: Left Arm, Patient Position: Sitting, Cuff Size: Normal)   Pulse 85   Ht 5' (1.524 m)   Wt 151 lb (68.5 kg)   BMI 29.49 kg/m  GENERAL:  well appearing, in no acute distress, alert  SKIN:  Color, texture, turgor normal. No rashes or lesions HEAD:  Normocephalic/atraumatic. RESP: normal respiratory effort  NEUROLOGICAL: Mental Status: Alert, oriented to person, place and time, Follows commands, and Speech fluent and appropriate. Cranial Nerves: PERRL, face symmetric, no dysarthria, hearing grossly intact Motor: moves all extremities equally Gait: normal-based.  IMPRESSION: 27 year old female with a history of asthma and GERD who presents for follow  up of migraines. Bennie Pierini was effective, but insurance would not cover this unless she failed Ajovy, Aimovig, and Emgality. She has not yet started Nurtec. Counseled on how to take Nurtec for rescue and for prevention. Advised to avoid taking Ubrelvy and Nurtec together.  PLAN: -Prevention/Rescue: Nurtec 75 mg every other day -Discussed that if this is ineffective, she will need to try Ajovy and Aimovig before her insurance will cover Qulipta  Follow-up: 6 months  I spent a total of 19 minutes on the date of the service. Headache education was done. Discussed treatment options including preventive and acute medications. Discussed medication side effects, adverse reactions and drug interactions. Written educational materials and patient instructions outlining all of the above were given.  Ocie Doyne, MD 09/20/23 9:15 AM

## 2023-09-30 ENCOUNTER — Other Ambulatory Visit: Payer: Self-pay | Admitting: Allergy & Immunology

## 2023-09-30 ENCOUNTER — Telehealth: Payer: Self-pay | Admitting: Allergy & Immunology

## 2023-09-30 NOTE — Telephone Encounter (Signed)
Patient called requesting a refill on medication please call back @ [phone].

## 2023-09-30 NOTE — Telephone Encounter (Signed)
Called patient - DOB/NEED DPR - LMOVM advising Montelukast (Singulair) 10 mg was fill on 08/16/23 - 3 month supply - she should still have some tablets left.  Sending in a courtesy medication refill on [facility]  Patient advised to keep appt on 10/07/23 for future refills.

## 2023-09-30 NOTE — Telephone Encounter (Signed)
Patient called back requesting Montelukast and levocetirizine to be refilled.   [facility] [address] [place] [address]  Patient requesting call back once prescriptions sent in, [phone]

## 2023-09-30 NOTE — Telephone Encounter (Signed)
Is this appropriate to refill for the patient?  

## 2023-09-30 NOTE — Telephone Encounter (Signed)
When patient call back - please find out which medications is she needing refills on and the pharmacy please.  Patient last seen: 04/01/23 return in 6 months.   Patient has appt: 10/07/23 @ 3 pm w/Dr. Dellis Anes.

## 2023-09-30 NOTE — Telephone Encounter (Signed)
Lm for pt to call us back about this °

## 2023-10-07 ENCOUNTER — Ambulatory Visit: Payer: BC Managed Care – PPO | Admitting: Allergy & Immunology

## 2023-10-07 ENCOUNTER — Other Ambulatory Visit: Payer: Self-pay

## 2023-10-07 ENCOUNTER — Encounter: Payer: Self-pay | Admitting: Allergy & Immunology

## 2023-10-07 VITALS — BP 128/82 | HR 71 | Temp 98.5°F | Resp 14

## 2023-10-07 DIAGNOSIS — T7800XD Anaphylactic reaction due to unspecified food, subsequent encounter: Secondary | ICD-10-CM

## 2023-10-07 DIAGNOSIS — J3089 Other allergic rhinitis: Secondary | ICD-10-CM

## 2023-10-07 DIAGNOSIS — J4530 Mild persistent asthma, uncomplicated: Secondary | ICD-10-CM

## 2023-10-07 DIAGNOSIS — J302 Other seasonal allergic rhinitis: Secondary | ICD-10-CM | POA: Diagnosis not present

## 2023-10-07 DIAGNOSIS — K219 Gastro-esophageal reflux disease without esophagitis: Secondary | ICD-10-CM

## 2023-10-07 MED ORDER — AZELASTINE HCL 137 MCG/SPRAY NA SOLN: 2.0000 | Freq: Two times a day (BID) | NASAL | 3 refills | Status: DC

## 2023-10-07 MED ORDER — MONTELUKAST SODIUM 10 MG PO TABS: 10.0000 mg | ORAL_TABLET | Freq: Every day | ORAL | 3 refills | Status: DC

## 2023-10-07 MED ORDER — LEVOCETIRIZINE DIHYDROCHLORIDE 5 MG PO TABS: 5.0000 mg | ORAL_TABLET | Freq: Every evening | ORAL | 3 refills | Status: DC

## 2023-10-07 MED ORDER — XHANCE 93 MCG/ACT NA EXHU: 2.0000 | INHALANT_SUSPENSION | Freq: Two times a day (BID) | NASAL | 11 refills | Status: DC | PRN

## 2023-10-07 NOTE — Progress Notes (Signed)
FOLLOW UP  Date of Service/Encounter:  10/07/23   Assessment:   Mild persistent asthma, uncomplicated   Anaphylactic shock due to food (peanuts) - doing fine with avoidance   Allergic rhinitis (grasses, weeds, trees, dust mites, dog) - s/p 5+ years of immunotherapy when she was in [employer] school, but with worsening symptoms currently    GERD - well controlled  Plan/Recommendations:   1. Mild persistent asthma, uncomplicated - Lung testing looks amazing today. - We are not going to make any changes at this point.  - Daily controller medication(s): Singulair 10mg  daily  - Prior to physical activity: Xopenex 2 puffs 10-15 minutes before physical activity. - Rescue medications: Xopenex 4 puffs every 4-6 hours as needed - Asthma control goals:  * Full participation in all desired activities (may need albuterol before activity) * Albuterol use two time or less a week on average (not counting use with activity) * Cough interfering with sleep two time or less a month * Oral steroids no more than once a year * No hospitalizations  2. Anaphylactic shock due to food (peanuts) - Continue to avoid peanuts. Stephanie Simpson (epinephrine) is up to date.   3. Allergic rhinitis - Continue with Xyzal and Allegra.  - Continue with Singulair 10mg  daily. - Continue with the Xhance daily as you are doing.  - Continue with Astelin two sprays per nostril twice daily.  4. Return in about 6 months (around 04/06/2024).   Subjective:   Stephanie Simpson is a 27 y.o. 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  Discussed the use of AI scribe software for clinical note transcription with the patient and/or guardian, who gave verbal consent to proceed.  Stephanie Simpson is a 27 y.o. female presenting for a follow up visit.  She was last seen in April 2024.  At that time, her lung function looked fantastic.  We are continuing Singulair 10 mg daily.  We continued with her Xopenex as needed and Qvar added during flares.  She continues to avoid peanuts.  For her allergic rhinitis, we continue with Xyzal and Allegra as well as Singulair, Xhance, and Astelin.  Asthma/Respiratory Symptom History: The patient reports no recent use of albuterol or other inhalers since a bout of flu in December of the previous year. They are currently on Xyzal, Allegra, and Singulair for their asthma management and report no missed doses. The patient has not picked up Qvar recently and has not experienced any asthma flares. They also use Xopenex as needed. Stephanie Simpson's asthma has been well controlled. She has not required rescue medication, experienced nocturnal awakenings due to lower respiratory symptoms, nor have activities of daily living been limited. She has required no Emergency Department or Urgent Care visits for  her asthma. She has required zero courses of systemic steroids for asthma exacerbations since the last visit. ACT score today is 25, indicating excellent asthma symptom control.   Allergic Rhinitis Symptom History: The patient also uses a nasal spray, which they find effective and tolerable, despite its generally unfavorable taste. They have  previously used Ryaltris and Dymista for nasal symptoms. The patient also reports a recent sinus infection in early September, which was treated with a Z-Pak due to an allergy to Augmentin.  In addition to their asthma and sinus issues, the patient also suffers from migraines. They were previously on Qulipta, which was effective, but had to switch to Nurtec due to insurance issues. The patient reports that Nurtec is not as effective as Qulipta. They also take zonisamide for their migraines.  Food Allergy Symptom History: She has avoided peanuts and tree nuts without a problem. She is not interested in doing any kind of testing. He might need a new epinephrine auto-injector.    The patient also reports taking spironolactone for acne. They have not reported any new symptoms or changes in their health status. She is going to hear something about her school acceptance in February.   Otherwise, there have been no changes to her past medical history, surgical history, family history, or social history.    Review of systems otherwise negative other than that mentioned in the HPI.    Objective:   Blood pressure 128/82, pulse 71, temperature 98.5 F (36.9 C), temperature source Temporal, resp. rate 14, SpO2 98%. There is no height or weight on file to calculate BMI.    Physical Exam Vitals reviewed.  Constitutional:      Appearance: She is well-developed.  HENT:     Head: Normocephalic and atraumatic.     Right Ear: Tympanic membrane, ear canal and external ear normal.     Left Ear: Tympanic membrane, ear canal and external ear normal.     Nose: No nasal deformity, septal deviation, mucosal edema or rhinorrhea.     Right Turbinates: Enlarged, swollen and pale.     Left Turbinates: Enlarged, swollen and pale.     Right Sinus: No maxillary sinus tenderness or frontal sinus tenderness.     Left Sinus: No maxillary sinus tenderness or frontal sinus tenderness.     Comments: Polypoid sinus  degeneration noted bilaterally. Overall this looks relatively stable compared to previous examinations.     Mouth/Throat:     Lips: Pink.     Mouth: Mucous membranes are moist. Mucous membranes are not pale and not dry.     Pharynx: Uvula midline.  Eyes:     General: Lids are normal. Allergic shiner present.        Right eye: No discharge.        Left eye: No discharge.     Conjunctiva/sclera: Conjunctivae normal.     Right eye: Right conjunctiva is not injected. No chemosis.    Left eye: Left conjunctiva is not injected. No chemosis.    Pupils: Pupils are equal, round, and reactive to light.  Cardiovascular:     Rate and Rhythm: Normal rate and regular rhythm.     Heart sounds: Normal heart sounds.  Pulmonary:     Effort: Pulmonary effort is normal. No tachypnea, accessory muscle usage or respiratory distress.     Breath sounds: Normal breath sounds. No wheezing, rhonchi or rales.     Comments: Moving air well in all lung fields. Chest:     Chest  wall: No tenderness.  Lymphadenopathy:     Cervical: No cervical adenopathy.  Skin:    Coloration: Skin is not pale.     Findings: No abrasion, erythema, petechiae or rash. Rash is not papular, urticarial or vesicular.  Neurological:     Mental Status: She is alert.  Psychiatric:        Behavior: Behavior is cooperative.      Diagnostic studies: none      Stephanie Bonds, MD  [facility]

## 2023-10-07 NOTE — Patient Instructions (Addendum)
1. Mild persistent asthma, uncomplicated - Lung testing looks amazing today. - We are not going to make any changes at this point.  - Daily controller medication(s): Singulair 10mg  daily  - Prior to physical activity: Xopenex 2 puffs 10-15 minutes before physical activity. - Rescue medications: Xopenex 4 puffs every 4-6 hours as needed - Asthma control goals:  * Full participation in all desired activities (may need albuterol before activity) * Albuterol use two time or less a week on average (not counting use with activity) * Cough interfering with sleep two time or less a month * Oral steroids no more than once a year * No hospitalizations  2. Anaphylactic shock due to food (peanuts) - Continue to avoid peanuts. Audry Riles (epinephrine) is up to date.   3. Allergic rhinitis - Continue with Xyzal and Allegra.  - Continue with Singulair 10mg  daily. - Continue with the Xhance daily as you are doing.  - Continue with Astelin two sprays per nostril twice daily.  4. Return in about 6 months (around 04/06/2024).    Please inform us of any Emergency Department visits, hospitalizations, or changes in symptoms. Call us before going to the ED for breathing or allergy symptoms since we might be able to fit you in for a sick visit. Feel free to contact us anytime with any questions, problems, or concerns.  It was a pleasure to see you again today! I can't wait to hear how things go!   Websites that have reliable patient information: 1. American Academy of Asthma, Allergy, and Immunology: www.aaaai.org 2. Food Allergy Research and Education (FARE): foodallergy.org 3. Mothers of Asthmatics: http://www.asthmacommunitynetwork.org 4. American College of Allergy, Asthma, and Immunology: www.acaai.org   COVID-19 Vaccine Information can be found at: [email or web address] For questions related to vaccine distribution or appointments, please email  [email or web address] or call [phone].     "Like" [place] on Facebook and Instagram for our latest updates!       Make sure you are registered to vote! If you have moved or changed any of your contact information, you will need to get this updated before voting!  In some cases, you MAY be able to register to vote online: [email or web address]

## 2023-10-08 ENCOUNTER — Telehealth: Payer: Self-pay

## 2023-10-08 ENCOUNTER — Other Ambulatory Visit (HOSPITAL_COMMUNITY): Payer: Self-pay

## 2023-10-08 NOTE — Telephone Encounter (Signed)
*  Asthma/Allergy  Pharmacy Patient Advocate Encounter   Received notification from CoverMyMeds that prior authorization for [facility] 93MCG/ACT exhaler suspension  is required/requested.   Insurance verification completed.   The patient is insured through [facility] .   Per test claim: PA required; PA started via CoverMyMeds. KEY [id number] . Waiting for clinical questions to populate.

## 2023-10-13 MED ORDER — XHANCE 93 MCG/ACT NA EXHU: 2.0000 | INHALANT_SUSPENSION | Freq: Two times a day (BID) | NASAL | 11 refills | Status: DC | PRN

## 2023-10-13 NOTE — Telephone Encounter (Signed)
Pharmacy Patient Advocate Encounter  Received notification from [facility] that Prior Authorization for [facility] 93MCG/ACT exhaler suspension has been APPROVED from 10-08-2023 to 10-07-2024   PA #/Case ID/Reference #: [id number]

## 2023-10-13 NOTE — Telephone Encounter (Signed)
PHARMACY NOTIFIED.

## 2023-11-04 DIAGNOSIS — L700 Acne vulgaris: Secondary | ICD-10-CM | POA: Diagnosis not present

## 2023-11-04 DIAGNOSIS — L219 Seborrheic dermatitis, unspecified: Secondary | ICD-10-CM | POA: Diagnosis not present

## 2023-12-13 DIAGNOSIS — F909 Attention-deficit hyperactivity disorder, unspecified type: Secondary | ICD-10-CM | POA: Diagnosis not present

## 2023-12-23 ENCOUNTER — Telehealth: Payer: Self-pay

## 2023-12-23 NOTE — Telephone Encounter (Signed)
   It is time to renew PA-PT has not been evaluated since starting-From notes PT has not tried the Nurtec for preventative of migraines at last visit-Insurance would like to know if pt is taking as a preventtaive or abortive, also if PT has had improvement of migraines in QTY, Duration and severity. Please look at the above questions and please advise.

## 2023-12-27 NOTE — Telephone Encounter (Signed)
I called home #, he stated call her mobile.  I LMVM for her to call back regarding questions about nurtec.

## 2024-01-03 NOTE — Telephone Encounter (Signed)
 Called pt back. Her father Adela Lank (on [place]) answered and will have her call us back tomorrow during office hours.

## 2024-01-03 NOTE — Telephone Encounter (Signed)
Pt returning call. Requesting call back.  

## 2024-01-04 NOTE — Telephone Encounter (Signed)
 Pt called me back. She states yes Nurtec is helping some. Stephanie Simpson is better but she has had some improvement in migraines and a reduced need for acute migraine medications. She will keep her current appt in April w/ Shanda Bumps NP for follow-up.

## 2024-01-05 ENCOUNTER — Other Ambulatory Visit (HOSPITAL_COMMUNITY): Payer: Self-pay

## 2024-01-05 NOTE — Telephone Encounter (Signed)
 Pharmacy Patient Advocate Encounter   Received notification from Physician's Office that prior authorization for Nurtec 75MG  dispersible tablets is required/requested.   Insurance verification completed.   The patient is insured through [facility] .   Per test claim: PA required; PA submitted to above mentioned insurance via CoverMyMeds Key/confirmation #/EOC [id number] Status is pending

## 2024-01-07 ENCOUNTER — Other Ambulatory Visit (HOSPITAL_COMMUNITY): Payer: Self-pay

## 2024-01-07 NOTE — Telephone Encounter (Signed)
 Pharmacy Patient Advocate Encounter  Received notification from [facility] that Prior Authorization for Nurtec 75MG  dispersible tablets has been APPROVED from 01/06/2024 to 01/04/2025. Ran test claim, Copay is $0. This test claim was processed through [facility]- copay amounts may vary at other pharmacies due to pharmacy/plan contracts, or as the patient moves through the different stages of their insurance plan.   PA #/Case ID/Reference #: PA Case ID #: [id number]

## 2024-01-28 ENCOUNTER — Telehealth: Payer: Self-pay | Admitting: Psychiatry

## 2024-01-28 MED ORDER — NURTEC 75 MG PO TBDP: 75.0000 mg | ORAL_TABLET | ORAL | 5 refills | Status: DC

## 2024-01-28 NOTE — Telephone Encounter (Signed)
Pt is asking that her Rimegepant Sulfate (NURTEC) 75 MG TBDP  now be [id number]

## 2024-01-28 NOTE — Telephone Encounter (Signed)
Pt last saw Dr. Delena Bali 09/20/23. Has upcoming appt w/ JM,NP 04/10/24. E-scribed refill as requested.

## 2024-03-06 DIAGNOSIS — D485 Neoplasm of uncertain behavior of skin: Secondary | ICD-10-CM | POA: Diagnosis not present

## 2024-03-06 DIAGNOSIS — H31091 Other chorioretinal scars, right eye: Secondary | ICD-10-CM | POA: Diagnosis not present

## 2024-03-06 DIAGNOSIS — H04123 Dry eye syndrome of bilateral lacrimal glands: Secondary | ICD-10-CM | POA: Diagnosis not present

## 2024-03-23 ENCOUNTER — Telehealth: Payer: Self-pay | Admitting: Psychiatry

## 2024-03-23 MED ORDER — ZONISAMIDE 100 MG PO CAPS: 300.0000 mg | ORAL_CAPSULE | Freq: Every day | ORAL | 0 refills | Status: DC

## 2024-03-23 NOTE — Telephone Encounter (Signed)
 Pt last saw Dr. Delena Bali 09/20/23. Next f/u 04/10/24 with Shanda Bumps M,NP. No mention for pt to continue zonisamide in Dr. Quentin Mulling last OV note. Pt last refilled zonisamide 09/18/23 #270 (90 days supply)  Per last note: Pt prevention/rescue med: Nurtec every other day. Last refilled 01/28/24 #16.  I called pt to discuss. She ran out of zonisamide about two months ago. States pharmacy sent refill requests but I do not see any on our end received.  She thought Dr. Delena Bali wanted her on both zonisamide and Nurtec. Aware I will send to work in [place] this am to see what they recommend and call her back.   She has been taking Nurtec 2x/week d/t not remembering to take every other day.

## 2024-03-23 NOTE — Addendum Note (Signed)
 Addended by: Aura Camps on: 03/23/2024 03:56 PM   Modules accepted: Orders

## 2024-03-23 NOTE — Telephone Encounter (Signed)
 Pt is needing a refill on her zonisamide (ZONEGRAN) 100 MG capsule [facility] [address]

## 2024-03-23 NOTE — Telephone Encounter (Addendum)
 Called pt on cell # [phone] given by pt dad and informed her that Rx was approved and sent to pharmacy.

## 2024-04-06 ENCOUNTER — Encounter: Payer: Self-pay | Admitting: Allergy & Immunology

## 2024-04-06 ENCOUNTER — Ambulatory Visit (INDEPENDENT_AMBULATORY_CARE_PROVIDER_SITE_OTHER): Payer: BC Managed Care – PPO | Admitting: Allergy & Immunology

## 2024-04-06 ENCOUNTER — Other Ambulatory Visit: Payer: Self-pay | Admitting: Allergy & Immunology

## 2024-04-06 VITALS — BP 120/78 | HR 82 | Temp 98.0°F | Ht 60.0 in | Wt 158.0 lb

## 2024-04-06 DIAGNOSIS — J302 Other seasonal allergic rhinitis: Secondary | ICD-10-CM

## 2024-04-06 DIAGNOSIS — J4530 Mild persistent asthma, uncomplicated: Secondary | ICD-10-CM | POA: Diagnosis not present

## 2024-04-06 DIAGNOSIS — J3089 Other allergic rhinitis: Secondary | ICD-10-CM

## 2024-04-06 DIAGNOSIS — T7800XD Anaphylactic reaction due to unspecified food, subsequent encounter: Secondary | ICD-10-CM | POA: Diagnosis not present

## 2024-04-06 DIAGNOSIS — K219 Gastro-esophageal reflux disease without esophagitis: Secondary | ICD-10-CM | POA: Diagnosis not present

## 2024-04-06 MED ORDER — OMEPRAZOLE 20 MG PO CPDR: 20.0000 mg | DELAYED_RELEASE_CAPSULE | Freq: Every day | ORAL | 1 refills | Status: DC

## 2024-04-06 MED ORDER — LEVALBUTEROL TARTRATE 45 MCG/ACT IN AERO: 2.0000 | INHALATION_SPRAY | Freq: Four times a day (QID) | RESPIRATORY_TRACT | 1 refills | Status: DC | PRN

## 2024-04-06 MED ORDER — AZELASTINE HCL 137 MCG/SPRAY NA SOLN: 2.0000 | Freq: Two times a day (BID) | NASAL | 3 refills | Status: AC

## 2024-04-06 MED ORDER — EPINEPHRINE 0.3 MG/0.3ML IJ SOAJ: INTRAMUSCULAR | 2 refills | Status: DC

## 2024-04-06 MED ORDER — XHANCE 93 MCG/ACT NA EXHU: 2.0000 | INHALANT_SUSPENSION | Freq: Two times a day (BID) | NASAL | 11 refills | Status: DC | PRN

## 2024-04-06 MED ORDER — PREDNISONE 10 MG PO TABS: ORAL_TABLET | ORAL | 0 refills | Status: DC

## 2024-04-06 MED ORDER — LEVOCETIRIZINE DIHYDROCHLORIDE 5 MG PO TABS
5.0000 mg | ORAL_TABLET | Freq: Every evening | ORAL | 3 refills | Status: DC
Start: 2024-04-06 — End: 2024-10-05

## 2024-04-06 NOTE — Patient Instructions (Addendum)
 1. Mild persistent asthma, uncomplicated - Lung testing looks amazing today. - We are not going to make any changes.  - Daily controller medication(s): Singulair 10mg  daily  - Prior to physical activity: Xopenex 2 puffs 10-15 minutes before physical activity. - Rescue medications: Xopenex 4 puffs every 4-6 hours as needed - Asthma control goals:  * Full participation in all desired activities (may need albuterol before activity) * Albuterol use two time or less a week on average (not counting use with activity) * Cough interfering with sleep two time or less a month * Oral steroids no more than once a year * No hospitalizations  2. Anaphylactic shock due to food (peanuts) - Continue to avoid peanuts. Audry Riles (epinephrine) is up to date.   3. Allergic rhinitis - Continue with Xyzal and Allegra.  - Continue with Singulair 10mg  daily. - Continue with the Xhance but increase to TWICE DAILY.  - Continue with Astelin two sprays per nostril twice daily. - CPT codes provided for ONE VIAL/ONE INJECTION.   4. Return in about 6 months (around 10/06/2024). You can have the follow up appointment with Dr. Dellis Anes or a Nurse Practicioner (our Nurse Practitioners are excellent and always have Physician oversight!).    Please inform us of any Emergency Department visits, hospitalizations, or changes in symptoms. Call us before going to the ED for breathing or allergy symptoms since we might be able to fit you in for a sick visit. Feel free to contact us anytime with any questions, problems, or concerns.  It was a pleasure to see you again today!  Websites that have reliable patient information: 1. American Academy of Asthma, Allergy, and Immunology: www.aaaai.org 2. Food Allergy Research and Education (FARE): foodallergy.org 3. Mothers of Asthmatics: http://www.asthmacommunitynetwork.org 4. American College of Allergy, Asthma, and Immunology: www.acaai.org      "Like" [place] on Facebook and  Instagram for our latest updates!      A healthy democracy works best when [employer] participate! Make sure you are registered to vote! If you have moved or changed any of your contact information, you will need to get this updated before voting! Scan the QR codes below to learn more!      Allergy Shots  Allergies are the result of a chain reaction that starts in the immune system. Your immune system controls how your body defends itself. For instance, if you have an allergy to pollen, your immune system identifies pollen as an invader or allergen. Your immune system overreacts by producing antibodies called Immunoglobulin E (IgE). These antibodies travel to cells that release chemicals, causing an allergic reaction.  The concept behind allergy immunotherapy, whether it is received in the form of shots or tablets, is that the immune system can be desensitized to specific allergens that trigger allergy symptoms. Although it requires time and patience, the payback can be long-term relief. Allergy injections contain a dilute solution of those substances that you are allergic to based upon your skin testing and allergy history.   How Do Allergy Shots Work?  Allergy shots work much like a vaccine. Your body responds to injected amounts of a particular allergen given in increasing doses, eventually developing a resistance and tolerance to it. Allergy shots can lead to decreased, minimal or no allergy symptoms.  There generally are two phases: build-up and maintenance. Build-up often ranges from three to six months and involves receiving injections with increasing amounts of the allergens. The shots are typically given once or twice a week,  though more rapid build-up schedules are sometimes used.  The maintenance phase begins when the most effective dose is reached. This dose is different for each person, depending on how allergic you are and your response to the build-up injections. Once the  maintenance dose is reached, there are longer periods between injections, typically two to four weeks.  Occasionally doctors give cortisone-type shots that can temporarily reduce allergy symptoms. These types of shots are different and should not be confused with allergy immunotherapy shots.  Who Can Be Treated with Allergy Shots?  Allergy shots may be a good treatment approach for people with allergic rhinitis (hay fever), allergic asthma, conjunctivitis (eye allergy) or stinging insect allergy.   Before deciding to begin allergy shots, you should consider:   The length of allergy season and the severity of your symptoms  Whether medications and/or changes to your environment can control your symptoms  Your desire to avoid long-term medication use  Time: allergy immunotherapy requires a major time commitment  Cost: may vary depending on your insurance coverage  Allergy shots for children age 81 and older are effective and often well tolerated. They might prevent the onset of new allergen sensitivities or the progression to asthma.  Allergy shots are not started on patients who are pregnant but can be continued on patients who become pregnant while receiving them. In some patients with other medical conditions or who take certain common medications, allergy shots may be of risk. It is important to mention other medications you talk to your allergist.   What are the two types of build-ups offered:   RUSH or Rapid Desensitization -- one day of injections lasting from 8:30-4:30pm, injections every 1 hour.  Approximately half of the build-up process is completed in that one day.  The following week, normal build-up is resumed, and this entails ~16 visits either weekly or twice weekly, until reaching your "maintenance dose" which is continued weekly until eventually getting spaced out to every month for a duration of 3 to 5 years. The regular build-up appointments are nurse visits where the  injections are administered, followed by required monitoring for 30 minutes.    Traditional build-up -- weekly visits for 6 -12 months until reaching "maintenance dose", then continue weekly until eventually spacing out to every 4 weeks as above. At these appointments, the injections are administered, followed by required monitoring for 30 minutes.     Either way is acceptable, and both are equally effective. With the rush protocol, the advantage is that less time is spent here for injections overall AND you would also reach maintenance dosing faster (which is when the clinical benefit starts to become more apparent). Not everyone is a candidate for rapid desensitization.   IF we proceed with the RUSH protocol, there are premedications which must be taken the day before and the day after the rush only (this includes antihistamines, steroids, and Singulair).  After the rush day, no prednisone or Singulair is required, and we just recommend antihistamines taken on your injection day.  What Is An Estimate of the Costs?  If you are interested in starting allergy injections, please check with your insurance company about your coverage for both allergy vial sets and allergy injections.  Please do so prior to making the appointment to start injections.  The following are CPT codes to give to your insurance company. These are the amounts we BILL to the insurance company, but the amount YOU WILL PAY and WE RECEIVE IS SUBSTANTIALLY LESS and  depends on the contracts we have with different insurance companies.   Amount Billed to Insurance One allergy vial set  CPT 95165   $ 1200     One injection   CPT 95115   $ 35  RUSH (Rapid Desensitization) CPT 95180 x 8 hours  $500/hour  Regarding the allergy injections, your co-pay may or may not apply with each injection, so please confirm this with your insurance company. When you start allergy injections, 1 or 2 sets of vials are made based on your allergies.  Not all  patients can be on one set of vials. A set of vials lasts 6 months to a year depending on how quickly you can proceed with your build-up of your allergy injections. Vials are personalized for each patient depending on their specific allergens.  How often are allergy injection given during the build-up period?   Injections are given at least weekly during the build-up period until your maintenance dose is achieved. Per the doctor's discretion, you may have the option of getting allergy injections two times per week during the build-up period. However, there must be at least 48 hours between injections. The build-up period is usually completed within 6-12 months depending on your ability to schedule injections and for adjustments for reactions. When maintenance dose is reached, your injection schedule is gradually changed to every two weeks and later to every three weeks. Injections will then continue every 4 weeks. Usually, injections are continued for a total of 3-5 years.   When Will I Feel Better?  Some may experience decreased allergy symptoms during the build-up phase. For others, it may take as long as 12 months on the maintenance dose. If there is no improvement after a year of maintenance, your allergist will discuss other treatment options with you.  If you aren't responding to allergy shots, it may be because there is not enough dose of the allergen in your vaccine or there are missing allergens that were not identified during your allergy testing. Other reasons could be that there are high levels of the allergen in your environment or major exposure to non-allergic triggers like tobacco smoke.  What Is the Length of Treatment?  Once the maintenance dose is reached, allergy shots are generally continued for three to five years. The decision to stop should be discussed with your allergist at that time. Some people may experience a permanent reduction of allergy symptoms. Others may relapse and  a longer course of allergy shots can be considered.  What Are the Possible Reactions?  The two types of adverse reactions that can occur with allergy shots are local and systemic. Common local reactions include very mild redness and swelling at the injection site, which can happen immediately or several hours after. Report a delayed reaction from your last injection. These include arm swelling or runny nose, watery eyes or cough that occurs within 12-24 hours after injection. A systemic reaction, which is less common, affects the entire body or a particular body system. They are usually mild and typically respond quickly to medications. Signs include increased allergy symptoms such as sneezing, a stuffy nose or hives.   Rarely, a serious systemic reaction called anaphylaxis can develop. Symptoms include swelling in the throat, wheezing, a feeling of tightness in the chest, nausea or dizziness. Most serious systemic reactions develop within 30 minutes of allergy shots. This is why it is strongly recommended you wait in your doctor's office for 30 minutes after your injections. Your allergist is  trained to watch for reactions, and his or her staff is trained and equipped with the proper medications to identify and treat them.   Report to the nurse immediately if you experience any of the following symptoms: swelling, itching or redness of the skin, hives, watery eyes/nose, breathing difficulty, excessive sneezing, coughing, stomach pain, diarrhea, or light headedness. These symptoms may occur within 15-20 minutes after injection and may require medication.   Who Should Administer Allergy Shots?  The preferred location for receiving shots is your prescribing allergist's office. Injections can sometimes be given at another facility where the physician and staff are trained to recognize and treat reactions, and have received instructions by your prescribing allergist.  What if I am late for an injection?    Injection dose will be adjusted depending upon how many days or weeks you are late for your injection.   What if I am sick?   Please report any illness to the nurse before receiving injections. She may adjust your dose or postpone injections depending on your symptoms. If you have fever, flu, sinus infection or chest congestion it is best to postpone allergy injections until you are better. Never get an allergy injection if your asthma is causing you problems. If your symptoms persist, seek out medical care to get your health problem under control.  What If I am or Become Pregnant:  Women that become pregnant should schedule an appointment with [facility] before receiving any further allergy injections.

## 2024-04-06 NOTE — Progress Notes (Unsigned)
 FOLLOW UP  Date of Service/Encounter:  04/06/24   Assessment:   Mild persistent asthma, uncomplicated   Anaphylactic shock due to food (peanuts) - doing fine with avoidance   Allergic rhinitis (grasses, weeds, trees, dust mites, dog) - s/p 5+ years of immunotherapy when she was in [employer] school, but with worsening symptoms currently and therefore considering re-initiating allergen immunotherapy   GERD - well controlled  Plan/Recommendations:   1. Mild persistent asthma, uncomplicated - Lung testing looks amazing today. - We are not going to make any changes.  - Daily controller medication(s): Singulair 10mg  daily  - Prior to physical activity: Xopenex 2 puffs 10-15 minutes before physical activity. - Rescue medications: Xopenex 4 puffs every 4-6 hours as needed - Asthma control goals:  * Full participation in all desired activities (may need albuterol before activity) * Albuterol use two time or less a week on average (not counting use with activity) * Cough interfering with sleep two time or less a month * Oral steroids no more than once a year * No hospitalizations  2. Anaphylactic shock due to food (peanuts) - Continue to avoid peanuts. Audry Riles (epinephrine) is up to date.   3. Allergic rhinitis - Continue with Xyzal and Allegra.  - Continue with Singulair 10mg  daily. - Continue with the Xhance but increase to TWICE DAILY.  - Continue with Astelin two sprays per nostril twice daily. - CPT codes provided for ONE VIAL/ONE INJECTION.   4. Return in about 6 months (around 10/06/2024). You can have the follow up appointment with Dr. Dellis Anes or a Nurse Practicioner (our Nurse Practitioners are excellent and always have Physician oversight!).   Subjective:   Stephanie Simpson is a 28 y.o. 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  Discussed the use of AI scribe software for clinical note transcription with the patient and/or guardian, who gave verbal consent to proceed.  Stephanie Simpson is a 28 y.o. female presenting for a follow up visit.  She was last seen in October 2024.  At that time, her lung testing looked amazing.  We continue with Singulair 10 mg daily as well as Xopenex as needed.  For her food allergies, her Auvi-Q was up-to-date.  She continue to avoid peanuts.  For her allergic rhinitis, she continue with Xyzal and Allegra as well as Singulair and Xhance.  She also had Astelin as well.  Since last visit, she has been well. However her allergic rhinitis symptoms have been much more severe this year compared to last year.   Asthma/Respiratory Symptom History: Her asthma is well-controlled, with infrequent use of Xopenex as a rescue inhaler. She reports no recent respiratory exacerbations. Kandie's  asthma has been well controlled. She has not required rescue medication, experienced nocturnal awakenings due to lower respiratory symptoms, nor have activities of daily living been limited. She has required no Emergency Department or Urgent Care visits for her asthma. She has required zero courses of systemic steroids for asthma exacerbations since the last visit.  ACT score today is 25, indicating excellent asthma symptom control.   Allergic Rhinitis Symptom History: She is experiencing exacerbated allergy symptoms, including rhinorrhea and pruritic eyes, despite being on a regimen of Xyzal, Allegra, Singulair, and Xhance. She administers both nasal sprays twice daily. She completed a series of allergy immunotherapy years ago, which previously ameliorated her symptoms. Her ocular symptoms have intensified, hindering her ability to wear contact lenses for prolonged periods. She has not yet utilized any ophthalmic drops.  Food Allergy Symptom History: She continues to avoid peanuts and tree nuts.  EpiPen is up-to-date.  She started a new book today, but she has not gotten in far enough to know the plot.   Otherwise, there have been no changes to her past medical history, surgical history, family history, or social history.    Review of systems otherwise negative other than that mentioned in the HPI.    Objective:   Blood pressure 120/78, pulse 82, temperature 98 F (36.7 C), height 5' (1.524 m), weight 158 lb (71.7 kg), SpO2 98%. Body mass index is 30.86 kg/m.    Physical Exam Vitals reviewed.  Constitutional:      Appearance: She is well-developed.     Comments: Lovely as always.   HENT:     Head: Normocephalic and atraumatic.     Right Ear: Tympanic membrane, ear canal and external ear normal.     Left Ear: Tympanic membrane, ear canal and external ear normal.     Nose: No nasal deformity, septal deviation, mucosal edema or rhinorrhea.     Right Turbinates: Enlarged, swollen and pale.     Left Turbinates: Enlarged, swollen and pale.     Right Sinus: No maxillary sinus tenderness or frontal sinus tenderness.     Left Sinus: No maxillary sinus tenderness or frontal sinus tenderness.     Comments: Polypoid sinus degeneration noted bilaterally. Overall this looks relatively stable compared to previous examinations.     Mouth/Throat:      Lips: Pink.     Mouth: Mucous membranes are moist. Mucous membranes are not pale and not dry.     Pharynx: Uvula midline.  Eyes:     General: Lids are normal. Allergic shiner present.        Right eye: No discharge.        Left eye: No discharge.     Conjunctiva/sclera: Conjunctivae normal.     Right eye: Right conjunctiva is not injected. No chemosis.    Left eye: Left conjunctiva is not injected. No chemosis.    Pupils: Pupils are equal, round, and reactive to light.  Cardiovascular:     Rate and Rhythm: Normal rate and regular rhythm.     Heart sounds: Normal heart sounds.  Pulmonary:     Effort: Pulmonary effort is normal. No tachypnea, accessory muscle usage or respiratory distress.     Breath sounds: Normal breath sounds. No wheezing, rhonchi or rales.     Comments: Moving air well in all lung fields. No increased work of breathing noted.  Chest:     Chest wall: No tenderness.  Lymphadenopathy:     Cervical: No cervical adenopathy.  Skin:  Coloration: Skin is not pale.     Findings: No abrasion, erythema, petechiae or rash. Rash is not papular, urticarial or vesicular.  Neurological:     Mental Status: She is alert.  Psychiatric:        Behavior: Behavior is cooperative.      Diagnostic studies:    Spirometry: results normal (FEV1: 2.45/101%, FVC: 3.01/107%, FEV1/FVC: 81%).    Spirometry consistent with normal pattern.   Allergy Studies: none        Malachi Bonds, MD  [facility]

## 2024-04-07 ENCOUNTER — Encounter: Payer: Self-pay | Admitting: Allergy & Immunology
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
## 2024-04-10 ENCOUNTER — Encounter: Payer: Self-pay | Admitting: Adult Health

## 2024-04-10 ENCOUNTER — Ambulatory Visit: Payer: BC Managed Care – PPO | Admitting: Adult Health

## 2024-04-10 VITALS — BP 131/91 | HR 82 | Ht 60.0 in | Wt 157.0 lb

## 2024-04-10 DIAGNOSIS — G43119 Migraine with aura, intractable, without status migrainosus: Secondary | ICD-10-CM | POA: Diagnosis not present

## 2024-04-10 MED ORDER — QULIPTA 60 MG PO TABS: 60.0000 mg | ORAL_TABLET | Freq: Every day | ORAL | 3 refills | Status: AC

## 2024-04-10 MED ORDER — UBRELVY 100 MG PO TABS: 100.0000 mg | ORAL_TABLET | ORAL | 11 refills | Status: AC | PRN

## 2024-04-10 NOTE — Telephone Encounter (Signed)
 I sent in AirSupra with the copay card instead.   Drexel Gentles, MD [facility] 

## 2024-04-10 NOTE — Patient Instructions (Addendum)
 Your Plan:  Will try to get [place] approved through insurance - this can take 1-2 weeks for approval, if you do not hear anything after 2 weeks, please let me know   Angie Bari for migraine rescue, can repeat x1 after 2 hours if needed, max dose 2 tabs in 24 hours     Follow up 6 months or call earlier if needed     Thank you for coming to see us  at [facility]. I hope we have been able to provide you high quality care today.  You may receive a patient satisfaction survey over the next few weeks. We would appreciate your feedback and comments so that we may continue to improve ourselves and the health of our patients.

## 2024-04-17 DIAGNOSIS — Z6830 Body mass index (BMI) 30.0-30.9, adult: Secondary | ICD-10-CM | POA: Diagnosis not present

## 2024-04-17 DIAGNOSIS — J0141 Acute recurrent pansinusitis: Secondary | ICD-10-CM | POA: Diagnosis not present

## 2024-04-20 ENCOUNTER — Ambulatory Visit (INDEPENDENT_AMBULATORY_CARE_PROVIDER_SITE_OTHER): Admitting: Allergy & Immunology

## 2024-04-20 ENCOUNTER — Encounter: Payer: Self-pay | Admitting: Allergy & Immunology

## 2024-04-20 ENCOUNTER — Other Ambulatory Visit: Payer: Self-pay

## 2024-04-20 VITALS — BP 126/84 | HR 82 | Temp 98.0°F

## 2024-04-20 DIAGNOSIS — K219 Gastro-esophageal reflux disease without esophagitis: Secondary | ICD-10-CM

## 2024-04-20 DIAGNOSIS — J3089 Other allergic rhinitis: Secondary | ICD-10-CM

## 2024-04-20 DIAGNOSIS — J4530 Mild persistent asthma, uncomplicated: Secondary | ICD-10-CM | POA: Diagnosis not present

## 2024-04-20 DIAGNOSIS — J331 Polypoid sinus degeneration: Secondary | ICD-10-CM

## 2024-04-20 DIAGNOSIS — T7800XD Anaphylactic reaction due to unspecified food, subsequent encounter: Secondary | ICD-10-CM

## 2024-04-20 DIAGNOSIS — H938X3 Other specified disorders of ear, bilateral: Secondary | ICD-10-CM | POA: Diagnosis not present

## 2024-04-20 DIAGNOSIS — J302 Other seasonal allergic rhinitis: Secondary | ICD-10-CM

## 2024-04-20 MED ORDER — CEFDINIR 300 MG PO CAPS: 300.0000 mg | ORAL_CAPSULE | Freq: Two times a day (BID) | ORAL | 0 refills | Status: AC

## 2024-04-20 NOTE — Patient Instructions (Signed)
 1. Mild persistent asthma, uncomplicated - Lung testing not done today.  - Daily controller medication(s): Singulair  10mg  daily  - Prior to physical activity: Xopenex  2 puffs 10-15 minutes before physical activity. - Rescue medications: Xopenex  4 puffs every 4-6 hours as needed - Asthma control goals:  * Full participation in all desired activities (may need albuterol  before activity) * Albuterol  use two time or less a week on average (not counting use with activity) * Cough interfering with sleep two time or less a month * Oral steroids no more than once a year * No hospitalizations  2. Anaphylactic shock due to food (peanuts) - Continue to avoid peanuts. Vashti Gentles (epinephrine ) is up to date.   3. Allergic rhinitis - We are starting cefdinir  twice daily for ten days. - We are also ordering a sinus CT to look at what is going on in your nasal cavity.  - Continue with Xyzal  and Allegra .  - Continue with Singulair  10mg  daily. - Continue with the Xhance  but increase to TWICE DAILY.  - Continue with Astelin  two sprays per nostril twice daily. - Make an appointment to start your allergy shots.   4. Follow up as scheduled.    Please inform us  of any Emergency Department visits, hospitalizations, or changes in symptoms. Call us  before going to the ED for breathing or allergy symptoms since we might be able to fit you in for a sick visit. Feel free to contact us  anytime with any questions, problems, or concerns.  It was a pleasure to see you again today!  Websites that have reliable patient information: 1. American Academy of Asthma, Allergy, and Immunology: www.aaaai.org 2. Food Allergy Research and Education (FARE): foodallergy.org 3. Mothers of Asthmatics: http://www.asthmacommunitynetwork.org 4. American College of Allergy, Asthma, and Immunology: www.acaai.org      "Like" us  on Facebook and Instagram for our latest updates!      A healthy democracy works best when [employer]  participate! Make sure you are registered to vote! If you have moved or changed any of your contact information, you will need to get this updated before voting! Scan the QR codes below to learn more!

## 2024-04-20 NOTE — Progress Notes (Signed)
 FOLLOW UP  Date of Service/Encounter:  04/20/24   Assessment:   Mild persistent asthma, uncomplicated   Anaphylactic shock due to food (peanuts) - doing fine with avoidance   Allergic rhinitis (grasses, weeds, trees, dust mites, dog) - s/p 5+ years of immunotherapy when she was in [employer] school, but with worsening symptoms currently and therefore considering re-initiating allergen immunotherapy  Bilateral OME - with left ear otalgia   GERD - well controlled  Plan/Recommendations:   1. Mild persistent asthma, uncomplicated - Lung testing not done today.  - Daily controller medication(s): Singulair  10mg  daily  - Prior to physical activity: Xopenex  2 puffs 10-15 minutes before physical activity. - Rescue medications: Xopenex  4 puffs every 4-6 hours as needed - Asthma control goals:  * Full participation in all desired activities (may need albuterol  before activity) * Albuterol  use two time or less a week on average (not counting use with activity) * Cough interfering with sleep two time or less a month * Oral steroids no more than once a year * No hospitalizations  2. Anaphylactic shock due to food (peanuts) - Continue to avoid peanuts. Stephanie Simpson (epinephrine ) is up to date.   3. Allergic rhinitis - We are starting cefdinir  twice daily for ten days. - We are also ordering a sinus CT to look at what is going on in your nasal cavity.  - Continue with Xyzal  and Allegra .  - Continue with Singulair  10mg  daily. - Continue with the Xhance  but increase to TWICE DAILY.  - Continue with Astelin  two sprays per nostril twice daily. - Make an appointment to start your allergy shots.   4. Follow up as scheduled.   Subjective:   Stephanie Simpson is a 28 y.o. 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  Discussed the use of AI scribe software for clinical note transcription with the patient and/or guardian, who gave verbal consent to proceed.  Stephanie Simpson is a 28 y.o. female presenting for a follow up visit.  She was last seen on April 06, 2024.  At that time, lung testing looked amazing.  We did not make any medication changes.  We continue with Singulair  10 mg daily as well as Xopenex .  For her peanut  allergy, we continued avoidance of peanuts.  Epinephrine  was up-to-date.  We continue with Xyzal  and Allegra  as well as Singulair .  We increased her Xhance  to 2 sprays per nostril twice daily and continue with Astelin .  Since last visit, she has done well, at least until this weekend.   She has been experiencing severe sinus symptoms and dizziness since the weekend, particularly on Saturday and Sunday. The dizziness is most pronounced when lying down and getting up, accompanied by tinnitus. She suspects these symptoms are related to her  ears, as she was informed there might be fluid behind them. She has a history of allergies and was previously prescribed antibiotics, which were initially thought to address allergy-related symptoms.  She has a history of ulcerative colitis and recently completed a course of prednisone  on Tuesday. While on prednisone , her symptoms improved, but they worsened over the weekend. She is currently using  Xhance  twice a day and experiences intermittent nasal congestion, sometimes being able to breathe through her nose.  She has a history of migraines and is under the care of a neurologist. She was previously on Ubrelvy  for migraines, switched to Qulipta , but had to stop due to insurance issues. She has tried various treatments, including monthly injections of Emgality, but discontinued them due to discomfort with self-injection. She recently restarted Ubrelvy .  She does not think that her current symptoms are related to this since she has tolerated Ubrelvy  in the past.  She just got to get because it was an injection and she got tired of injecting herself.  She has multiple medication allergies, including azithromycin, which previously caused hives, and Augmentin. She experienced itchiness on Monday night after taking azithromycin but did not develop hives this time.  Family history reveals that both her parents have allergies, with her father's being more severe than her mother's. She mentions that her parents' allergies have not been as bothersome as hers recently.     Otherwise, there have been no changes to her past medical history, surgical history, family history, or social history.    Review of systems otherwise negative other than that mentioned in the HPI.    Objective:   Blood pressure 126/84, pulse 82, temperature 98 F (36.7 C), SpO2 97%. There is no height or weight on file to calculate BMI.    Physical Exam Vitals reviewed.  Constitutional:      Appearance: She is well-developed.     Comments: Lovely as always.   HENT:     Head: Normocephalic and atraumatic.     Right Ear: Ear canal and external ear normal. A middle ear effusion is present.     Left Ear: Ear canal and external ear normal. Tenderness present. A middle ear effusion is present.     Nose: No nasal deformity, septal deviation, mucosal edema or rhinorrhea.     Right Turbinates: Enlarged, swollen and pale.      Left Turbinates: Enlarged, swollen and pale.     Right Sinus: No maxillary sinus tenderness or frontal sinus tenderness.     Left Sinus: No maxillary sinus tenderness or frontal sinus tenderness.     Comments: Polypoid sinus degeneration noted bilaterally. Overall this looks relatively stable compared to previous examinations.     Mouth/Throat:     Lips: Pink.     Mouth: Mucous membranes are moist. Mucous membranes are not pale and not dry.     Pharynx: Uvula midline.  Eyes:     General: Lids are normal. Allergic shiner present.        Right eye: No discharge.        Left eye: No discharge.     Conjunctiva/sclera: Conjunctivae normal.     Right eye: Right conjunctiva is not injected. No chemosis.    Left eye: Left conjunctiva is not injected. No chemosis.    Pupils: Pupils are equal, round, and reactive to light.  Cardiovascular:     Rate and Rhythm: Normal rate and regular rhythm.     Heart sounds: Normal heart sounds.  Pulmonary:  Effort: Pulmonary effort is normal. No tachypnea, accessory muscle usage or respiratory distress.     Breath sounds: Normal breath sounds. No wheezing, rhonchi or rales.     Comments: Moving air well in all lung fields. No increased work of breathing noted.  Chest:     Chest wall: No tenderness.  Lymphadenopathy:     Cervical: No cervical adenopathy.  Skin:    Coloration: Skin is not pale.     Findings: No abrasion, erythema, petechiae or rash. Rash is not papular, urticarial or vesicular.  Neurological:     Mental Status: She is alert.  Psychiatric:        Behavior: Behavior is cooperative.      Diagnostic studies: none    Drexel Gentles, MD  [facility] 

## 2024-04-24 DIAGNOSIS — J3089 Other allergic rhinitis: Secondary | ICD-10-CM | POA: Diagnosis not present

## 2024-04-24 NOTE — Progress Notes (Signed)
 Aeroallergen Immunotherapy  Ordering Provider: Dr. Drexel Gentles  Patient Details Name: Stephanie Simpson MRN: [id number] Date of Birth: July 05, 1996  Order 1 of 1  Vial Label: G/W/T/DM/D  GM, WM, birch, oak, maple, hickory, sweet gum   Dust mite, dog  0.3 ml (Volume)  BAU Concentration -- 7 Grass Mix* 100,000 ([place]  Blue, [place], Orchard, Perennial Rye, RedTop, Sweet Vernal, Timothy) 0.5 ml (Volume)  1:20 Concentration -- Weed Mix* 0.2 ml (Volume)  1:10 Concentration -- Amelia Jurist mix* 0.2 ml (Volume)  1:10 Concentration -- Hickory* 0.2 ml (Volume)  1:20 Concentration -- Maple Mix* 0.3 ml (Volume)  1:10 Concentration -- Oak, [place] mix* 0.2 ml (Volume)  1:20 Concentration -- Sweet Gum 1.0 ml (Volume)  1:10 Concentration -- Dog Epithelia 1.0 ml (Volume)   AU Concentration -- Mite Mix (DF 5,000 & DP 5,000)   3.9  ml Extract Subtotal 1.1  ml Diluent 5.0  ml Maintenance Total  Schedule:  B  Blue Vial (1:100,000): Schedule B (6 doses) Yellow Vial (1:10,000): Schedule B (6 doses) Green Vial (1:1,000): Schedule B (6 doses) Red Vial (1:100): Schedule A (12 doses)  Special Instructions: After completion of the first Red Vial, please space to every two weeks. After completion of the second Red Vial, please space to every 4 weeks. Ok to up dose new vials at 0.48mL --> 0.3 mL --> 0.5 mL. Ok to come twice weekly, if desired, as long as there is 48 hours between injections.

## 2024-04-24 NOTE — Progress Notes (Signed)
 VIAL SET MADE 04-24-24. EXP 04-24-25

## 2024-05-11 ENCOUNTER — Ambulatory Visit (INDEPENDENT_AMBULATORY_CARE_PROVIDER_SITE_OTHER)

## 2024-05-11 DIAGNOSIS — J309 Allergic rhinitis, unspecified: Secondary | ICD-10-CM

## 2024-05-11 NOTE — Progress Notes (Signed)
 Immunotherapy   Patient Details  Name: Stephanie Simpson MRN: [id number] Date of Birth: [birth year]  05/11/2024  [name] started injections for Pollen-DM-Dog. Patient received 0.05 ml of her blue vial with exp of 04/24/2025. Patient waited 30 minutes with no problems. Following schedule: B  Frequency:2 times per week Epi-Pen:Epi-Pen Available  Consent signed and patient instructions given.   Denton Flakes 05/11/2024, 10:07 AM

## 2024-05-15 ENCOUNTER — Ambulatory Visit (INDEPENDENT_AMBULATORY_CARE_PROVIDER_SITE_OTHER): Payer: Self-pay

## 2024-05-15 DIAGNOSIS — J309 Allergic rhinitis, unspecified: Secondary | ICD-10-CM

## 2024-05-18 ENCOUNTER — Ambulatory Visit (INDEPENDENT_AMBULATORY_CARE_PROVIDER_SITE_OTHER)

## 2024-05-18 DIAGNOSIS — J309 Allergic rhinitis, unspecified: Secondary | ICD-10-CM

## 2024-05-23 ENCOUNTER — Ambulatory Visit (INDEPENDENT_AMBULATORY_CARE_PROVIDER_SITE_OTHER): Payer: Self-pay

## 2024-05-23 DIAGNOSIS — J309 Allergic rhinitis, unspecified: Secondary | ICD-10-CM | POA: Diagnosis not present

## 2024-05-25 ENCOUNTER — Ambulatory Visit (INDEPENDENT_AMBULATORY_CARE_PROVIDER_SITE_OTHER)

## 2024-05-25 DIAGNOSIS — J309 Allergic rhinitis, unspecified: Secondary | ICD-10-CM

## 2024-05-30 ENCOUNTER — Ambulatory Visit (INDEPENDENT_AMBULATORY_CARE_PROVIDER_SITE_OTHER)

## 2024-05-30 DIAGNOSIS — J309 Allergic rhinitis, unspecified: Secondary | ICD-10-CM

## 2024-06-01 ENCOUNTER — Ambulatory Visit (INDEPENDENT_AMBULATORY_CARE_PROVIDER_SITE_OTHER): Payer: Self-pay

## 2024-06-01 DIAGNOSIS — J309 Allergic rhinitis, unspecified: Secondary | ICD-10-CM

## 2024-06-05 ENCOUNTER — Ambulatory Visit (INDEPENDENT_AMBULATORY_CARE_PROVIDER_SITE_OTHER): Payer: Self-pay

## 2024-06-05 DIAGNOSIS — J309 Allergic rhinitis, unspecified: Secondary | ICD-10-CM

## 2024-06-07 ENCOUNTER — Ambulatory Visit (INDEPENDENT_AMBULATORY_CARE_PROVIDER_SITE_OTHER): Payer: Self-pay

## 2024-06-07 DIAGNOSIS — J309 Allergic rhinitis, unspecified: Secondary | ICD-10-CM | POA: Diagnosis not present

## 2024-06-12 ENCOUNTER — Ambulatory Visit (INDEPENDENT_AMBULATORY_CARE_PROVIDER_SITE_OTHER): Payer: Self-pay

## 2024-06-12 DIAGNOSIS — J309 Allergic rhinitis, unspecified: Secondary | ICD-10-CM | POA: Diagnosis not present

## 2024-06-15 ENCOUNTER — Ambulatory Visit (INDEPENDENT_AMBULATORY_CARE_PROVIDER_SITE_OTHER): Payer: Self-pay

## 2024-06-15 DIAGNOSIS — J309 Allergic rhinitis, unspecified: Secondary | ICD-10-CM | POA: Diagnosis not present

## 2024-06-19 DIAGNOSIS — Z6831 Body mass index (BMI) 31.0-31.9, adult: Secondary | ICD-10-CM | POA: Diagnosis not present

## 2024-06-19 DIAGNOSIS — F909 Attention-deficit hyperactivity disorder, unspecified type: Secondary | ICD-10-CM | POA: Diagnosis not present

## 2024-06-20 ENCOUNTER — Ambulatory Visit (INDEPENDENT_AMBULATORY_CARE_PROVIDER_SITE_OTHER)

## 2024-06-20 DIAGNOSIS — J309 Allergic rhinitis, unspecified: Secondary | ICD-10-CM

## 2024-06-30 ENCOUNTER — Other Ambulatory Visit: Payer: Self-pay | Admitting: Diagnostic Neuroimaging

## 2024-07-04 ENCOUNTER — Ambulatory Visit (INDEPENDENT_AMBULATORY_CARE_PROVIDER_SITE_OTHER)

## 2024-07-04 DIAGNOSIS — J309 Allergic rhinitis, unspecified: Secondary | ICD-10-CM | POA: Diagnosis not present

## 2024-07-05 ENCOUNTER — Telehealth: Payer: Self-pay | Admitting: *Deleted

## 2024-07-05 NOTE — Telephone Encounter (Signed)
 Called pt. She had not started Qulipta  yet. She has been in [place] and just got back. Has samples but did not use. Has not called pharmacy to see if PA needed or not.    Called [facility] at [phone]. Spoke w/ tech. Checked and states Qulipta  has a note: Product not covered under insurance. Cost over 4000.00.  Aware we will send to PA to try and submit to see if we can get approval or not.

## 2024-07-05 NOTE — Telephone Encounter (Signed)
 Took call from phone room and spoke w/ pt. She had not started Qulipta  yet. She has been in [place] and just got back. Has samples but did not use. Has not called pharmacy to see if PA needed or not.   She also verified she is still taking zonisamide  100mg , 3 caps daily. Has been on this for at least 10 yr and under impression she was to continue. Verified she is taking Ubrelvy  prn and effective.  Aware I will call pharmacy to see if PA Qulipta  needed. If needed, will send to PA team. I will also speak to Harlene to see if its ok to refill zonisamide  and then will call her back at [phone].  She verbalized understanding.  Called [facility] at [phone]. Spoke w/ tech. Checked and states Qulipta  has a note: Product not covered under insurance. Cost over 4000.00.

## 2024-07-05 NOTE — Telephone Encounter (Signed)
 No discussion on last OV note that pt was to continue on zonisamide . Called pt at [phone]. Father answered (on [place]). He will have daughter call back to discuss refill request. Wanting to confirm how she is taking/if she is taking.   Last note mentions:   -Prevention: Recommend restart of Qulipta  as previously beneficial. Per insurance, requires trial of Emgality, Aimovig and Nurtec prior to approval.  Has previously tried Scientist, research (life sciences).  Aimovig contraindicated d/t constipation. -rescue: Restart Ubrelvy  100mg  PRN, can repeat x1 after 2 hrs if needed.  Max dose 200 mg per 24-hour - Discussed importance of avoiding estrogen containing birth control due to occasional migraines with aura     Follow-up in 6 months or call earlier if needed

## 2024-07-05 NOTE — Telephone Encounter (Signed)
 Spoke w/ Harlene who approved sending in refill of zonisamide .   Sent separate phone note to PA team to see if they can get approval for Qulipta , waiting on response.  Called pt at [phone]. LVM for her providing update above. Asked her to call back if she has any questions.

## 2024-07-06 ENCOUNTER — Other Ambulatory Visit (HOSPITAL_COMMUNITY): Payer: Self-pay

## 2024-07-06 ENCOUNTER — Telehealth: Payer: Self-pay

## 2024-07-06 NOTE — Telephone Encounter (Signed)
 Pharmacy Patient Advocate Encounter   Received notification from Physician's Office that prior authorization for Qulipta  60MG  tablets is required/requested.   Insurance verification completed.   The patient is insured through [facility] .   Per test claim: PA required; PA submitted to above mentioned insurance via CoverMyMeds Key/confirmation #/EOC [id number] Status is pending

## 2024-07-07 ENCOUNTER — Other Ambulatory Visit (HOSPITAL_COMMUNITY): Payer: Self-pay

## 2024-07-07 NOTE — Telephone Encounter (Signed)
 Pharmacy Patient Advocate Encounter  Received notification from [facility] that Prior Authorization for Qulipta  60MG  tablets has been APPROVED from 07/06/2024 to 09/28/2024. Ran test claim, Copay is $0. This test claim was processed through [facility]- copay amounts may vary at other pharmacies due to pharmacy/plan contracts, or as the patient moves through the different stages of their insurance plan.   PA #/Case ID/Reference #: PA Case ID #: [id number]

## 2024-07-10 ENCOUNTER — Other Ambulatory Visit: Payer: Self-pay | Admitting: Allergy & Immunology

## 2024-07-10 ENCOUNTER — Ambulatory Visit (INDEPENDENT_AMBULATORY_CARE_PROVIDER_SITE_OTHER)

## 2024-07-10 DIAGNOSIS — J309 Allergic rhinitis, unspecified: Secondary | ICD-10-CM

## 2024-07-20 ENCOUNTER — Ambulatory Visit (INDEPENDENT_AMBULATORY_CARE_PROVIDER_SITE_OTHER)

## 2024-07-20 DIAGNOSIS — J309 Allergic rhinitis, unspecified: Secondary | ICD-10-CM

## 2024-07-27 ENCOUNTER — Ambulatory Visit (INDEPENDENT_AMBULATORY_CARE_PROVIDER_SITE_OTHER)

## 2024-07-27 DIAGNOSIS — J309 Allergic rhinitis, unspecified: Secondary | ICD-10-CM

## 2024-08-07 ENCOUNTER — Ambulatory Visit (INDEPENDENT_AMBULATORY_CARE_PROVIDER_SITE_OTHER)

## 2024-08-07 DIAGNOSIS — J309 Allergic rhinitis, unspecified: Secondary | ICD-10-CM | POA: Diagnosis not present

## 2024-08-14 DIAGNOSIS — M545 Low back pain, unspecified: Secondary | ICD-10-CM | POA: Diagnosis not present

## 2024-08-15 ENCOUNTER — Other Ambulatory Visit (HOSPITAL_COMMUNITY): Payer: Self-pay

## 2024-08-15 ENCOUNTER — Telehealth: Payer: Self-pay

## 2024-08-15 NOTE — Telephone Encounter (Signed)
 Pharmacy Patient Advocate Encounter   Received notification from CoverMyMeds that prior authorization for Ubrelvy  100mg  Tablet is required/requested.   Insurance verification completed.   The patient is insured through [facility] .   Per test claim: PA required; PA submitted to above mentioned insurance via Latent Key/confirmation #/EOC [id number] Status is pending

## 2024-08-17 ENCOUNTER — Ambulatory Visit (INDEPENDENT_AMBULATORY_CARE_PROVIDER_SITE_OTHER)

## 2024-08-17 ENCOUNTER — Other Ambulatory Visit (HOSPITAL_COMMUNITY): Payer: Self-pay

## 2024-08-17 DIAGNOSIS — S39012D Strain of muscle, fascia and tendon of lower back, subsequent encounter: Secondary | ICD-10-CM | POA: Diagnosis not present

## 2024-08-17 DIAGNOSIS — J309 Allergic rhinitis, unspecified: Secondary | ICD-10-CM | POA: Diagnosis not present

## 2024-08-17 NOTE — Telephone Encounter (Signed)
 Pharmacy Patient Advocate Encounter  Received notification from [facility] that Prior Authorization for Ubrelvy  has been APPROVED from 08/15/2024 to 08/15/2025   PA #/Case ID/Reference #: [id number]

## 2024-08-21 ENCOUNTER — Ambulatory Visit (INDEPENDENT_AMBULATORY_CARE_PROVIDER_SITE_OTHER)

## 2024-08-21 DIAGNOSIS — J309 Allergic rhinitis, unspecified: Secondary | ICD-10-CM | POA: Diagnosis not present

## 2024-08-22 DIAGNOSIS — S39012D Strain of muscle, fascia and tendon of lower back, subsequent encounter: Secondary | ICD-10-CM | POA: Diagnosis not present

## 2024-08-31 DIAGNOSIS — R051 Acute cough: Secondary | ICD-10-CM | POA: Diagnosis not present

## 2024-08-31 DIAGNOSIS — J029 Acute pharyngitis, unspecified: Secondary | ICD-10-CM | POA: Diagnosis not present

## 2024-08-31 DIAGNOSIS — R0981 Nasal congestion: Secondary | ICD-10-CM | POA: Diagnosis not present

## 2024-08-31 DIAGNOSIS — R112 Nausea with vomiting, unspecified: Secondary | ICD-10-CM | POA: Diagnosis not present

## 2024-09-01 ENCOUNTER — Ambulatory Visit

## 2024-09-01 DIAGNOSIS — J309 Allergic rhinitis, unspecified: Secondary | ICD-10-CM | POA: Diagnosis not present

## 2024-09-05 ENCOUNTER — Ambulatory Visit (INDEPENDENT_AMBULATORY_CARE_PROVIDER_SITE_OTHER)

## 2024-09-05 DIAGNOSIS — J309 Allergic rhinitis, unspecified: Secondary | ICD-10-CM | POA: Diagnosis not present

## 2024-09-05 DIAGNOSIS — S39012D Strain of muscle, fascia and tendon of lower back, subsequent encounter: Secondary | ICD-10-CM | POA: Diagnosis not present

## 2024-09-11 ENCOUNTER — Ambulatory Visit (INDEPENDENT_AMBULATORY_CARE_PROVIDER_SITE_OTHER)

## 2024-09-11 DIAGNOSIS — J309 Allergic rhinitis, unspecified: Secondary | ICD-10-CM

## 2024-09-11 DIAGNOSIS — S39012D Strain of muscle, fascia and tendon of lower back, subsequent encounter: Secondary | ICD-10-CM | POA: Diagnosis not present

## 2024-09-22 ENCOUNTER — Ambulatory Visit (INDEPENDENT_AMBULATORY_CARE_PROVIDER_SITE_OTHER)

## 2024-09-22 DIAGNOSIS — J309 Allergic rhinitis, unspecified: Secondary | ICD-10-CM

## 2024-09-29 ENCOUNTER — Telehealth: Payer: Self-pay | Admitting: Pharmacist

## 2024-09-29 NOTE — Telephone Encounter (Signed)
 Pharmacy Patient Advocate Encounter   Received notification from CoverMyMeds that prior authorization for Qulipta  60MG  tablets is required/requested.   Insurance verification completed.   The patient is insured through [employer].   Per test claim: PA required; PA submitted to above mentioned insurance via Latent Key/confirmation #/EOC [id number] Status is pending

## 2024-09-29 NOTE — Telephone Encounter (Signed)
 Pharmacy Patient Advocate Encounter  Received notification from [facility] that Prior Authorization for QULIPTA  60 MG PO TABS has been APPROVED from 09/29/2024 to 09/29/2025   PA #/Case ID/Reference #: [id number]

## 2024-10-03 ENCOUNTER — Ambulatory Visit

## 2024-10-03 DIAGNOSIS — J309 Allergic rhinitis, unspecified: Secondary | ICD-10-CM

## 2024-10-05 ENCOUNTER — Other Ambulatory Visit: Payer: Self-pay

## 2024-10-05 ENCOUNTER — Ambulatory Visit (INDEPENDENT_AMBULATORY_CARE_PROVIDER_SITE_OTHER): Admitting: Allergy & Immunology

## 2024-10-05 ENCOUNTER — Encounter: Payer: Self-pay | Admitting: Allergy & Immunology

## 2024-10-05 VITALS — BP 108/76 | HR 88 | Temp 98.2°F | Ht 60.0 in | Wt 159.7 lb

## 2024-10-05 DIAGNOSIS — T7800XD Anaphylactic reaction due to unspecified food, subsequent encounter: Secondary | ICD-10-CM

## 2024-10-05 DIAGNOSIS — J3089 Other allergic rhinitis: Secondary | ICD-10-CM

## 2024-10-05 DIAGNOSIS — J302 Other seasonal allergic rhinitis: Secondary | ICD-10-CM

## 2024-10-05 DIAGNOSIS — J4530 Mild persistent asthma, uncomplicated: Secondary | ICD-10-CM

## 2024-10-05 DIAGNOSIS — K219 Gastro-esophageal reflux disease without esophagitis: Secondary | ICD-10-CM | POA: Diagnosis not present

## 2024-10-05 MED ORDER — XHANCE 93 MCG/ACT NA EXHU: 2.0000 | INHALANT_SUSPENSION | Freq: Two times a day (BID) | NASAL | 11 refills | Status: AC | PRN

## 2024-10-05 MED ORDER — FEXOFENADINE HCL 180 MG PO TABS: 180.0000 mg | ORAL_TABLET | Freq: Every day | ORAL | 1 refills | Status: AC

## 2024-10-05 MED ORDER — AIRSUPRA 90-80 MCG/ACT IN AERO: 2.0000 | INHALATION_SPRAY | RESPIRATORY_TRACT | 5 refills | Status: AC | PRN

## 2024-10-05 MED ORDER — LEVOCETIRIZINE DIHYDROCHLORIDE 5 MG PO TABS: 5.0000 mg | ORAL_TABLET | Freq: Every evening | ORAL | 3 refills | Status: AC

## 2024-10-05 MED ORDER — MONTELUKAST SODIUM 10 MG PO TABS: 10.0000 mg | ORAL_TABLET | Freq: Every day | ORAL | 3 refills | Status: AC

## 2024-10-05 MED ORDER — EPINEPHRINE 0.3 MG/0.3ML IJ SOAJ: INTRAMUSCULAR | 2 refills | Status: AC

## 2024-10-05 NOTE — Progress Notes (Addendum)
 "  FOLLOW UP  Date of Service/Encounter:  10/05/24   Assessment:   Mild persistent asthma, uncomplicated   Anaphylactic shock due to food (peanuts) - doing fine with avoidance   Allergic rhinitis (grasses, weeds, trees, dust mites, dog) - on allergen immunotherapy with maintenance reached August 2025  Chronic rhinosinusitis - with overlying allergic rhinitis (improved with Xhance )   Bilateral OME - with left ear otalgia   GERD - well controlled  Plan/Recommendations:   1. Mild persistent asthma, uncomplicated - Lung testing looks good today.  - We are not going to make any changes at this point in time.  - Daily controller medication(s): Singulair  10mg  daily  - Prior to physical activity: Xopenex  2 puffs 10-15 minutes before physical activity. - Rescue medications: Xopenex  4 puffs every 4-6 hours as needed - Asthma control goals:  * Full participation in all desired activities (may need albuterol  before activity) * Albuterol  use two time or less a week on average (not counting use with activity) * Cough interfering with sleep two time or less a month * Oral steroids no more than once a year * No hospitalizations  2. Anaphylactic shock due to food (peanuts) - Continue to avoid peanuts. - EpiPen  is up to date.   3. Allergic rhinitis - Continue with the allergy shots at the same schedule.  - You are in the Red (maintenance), so you should definitely start feeling better especially next spring.  - Continue with Xyzal  and Allegra .  - Continue with Singulair  10mg  daily. - Continue with the Xhance  but increase to TWICE DAILY.  - Continue with Astelin  two sprays per nostril twice daily.  4. Follow up in 6 months or earlier if needed.    Subjective:   Stephanie Simpson is a 28 y.o. 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  Discussed the use of AI scribe software for clinical note transcription with the patient and/or guardian, who gave verbal consent to proceed.  Stephanie Simpson is a 28 y.o. female presenting for a follow up visit.  She was last seen in April 2025.  At that time, we did not do lung testing.  We continue with Singulair  10 mg daily as well as Xopenex  2 puffs 10 to 15 minutes before physical activity.  She also continue to avoid peanuts.  Her epinephrine  autoinjector was up-to-date.  For her allergic rhinitis, we started her on cefdinir  for a sinus infection and ordered a sinus CT.  We continue with Xyzal  and Allegra  as well as Singulair  and Xhance .  She did make an appointment to start allergy shots.  Since last visit, she has done well.  Her mother expressed concern over  her prolonged symptoms in September, urging her to seek further medical attention. During a visit to a walk-in clinic for her sinus infection, she was advised to take Mucinex and was given a nasal spray. She is uncertain if she received antibiotics.  She reviewed her note and she did not receive antibiotic but was told to pursue symptomatic treatment only.  Asthma/Respiratory Symptom History: She experienced an exacerbation of her asthma symptoms following a  sinus infection in late August to early September, characterized by a 'weird asthma-like cough' and increased use of her rescue inhaler, Xopenex . Her inhaler was expired, but she used AirSupra , which provided relief.  Allergic Rhinitis Symptom History: She is currently taking Allegra  in the morning and Xyzal  at night for her allergies. She also has Xhance  and azelastine  nasal sprays, although she does not prefer using nasal sprays.  Allergy shots are going well.  Chantrice is on allergen immunotherapy. She receives one injection. Immunotherapy script #1 contains trees, weeds, grasses, dust mites, and dog. She currently receives 0.30mL of the RED vial (1/100). She started shots May of 2025 and reached maintenance in August of 2025.  Food Allergy Symptom History: She continues to avoid peanuts.  She denies any accidental exposures.  She has a history of migraines and is under the care of a neurologist. She was previously on Ubrelvy  for migraines, switched to Qulipta , but had to stop due to insurance issues. She has tried various treatments, including monthly injections of Emgality, but discontinued them due to discomfort with self-injection. She recently restarted Ubrelvy .  She does not think that her current symptoms are related to this since she has tolerated Ubrelvy  in the past.  She just got to get because it was an injection and she got tired of injecting herself.  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 She plans to visit [place] in December with her family.  This is the first time she has been back since she worked at [employer] for a period of time after graduation from college.  Otherwise, there have been no changes to her past medical history, surgical history, family history, or social history.    Review of systems otherwise negative other than that mentioned in the HPI.    Objective:   Blood pressure 108/76, pulse 88, temperature  98.2 F (36.8 C), temperature source Temporal, height 5' (1.524 m), weight 159 lb 11.2 oz (72.4 kg), SpO2 97%. Body mass index is 31.19 kg/m.    Physical Exam Constitutional:      Appearance: She is well-developed.     Comments: Lovely as always.   HENT:     Head: Normocephalic and atraumatic.     Right Ear: Ear canal and external ear normal.     Left Ear: Ear canal and external ear normal.     Nose: No nasal deformity, septal deviation, mucosal edema or rhinorrhea.     Right Turbinates: Enlarged, swollen and pale.     Left Turbinates: Enlarged, swollen and pale.     Right Sinus: No maxillary sinus tenderness or frontal sinus tenderness.     Left Sinus: No maxillary sinus tenderness or frontal sinus tenderness.     Comments: Polypoid sinus degeneration noted bilaterally. Overall this looks relatively stable compared to previous examinations.     Mouth/Throat:     Lips: Pink.     Mouth: Mucous membranes are moist. Mucous membranes are not pale and not dry.  Pharynx: Uvula midline.  Eyes:     General: Lids are normal. Allergic shiner present.        Right eye: No discharge.        Left eye: No discharge.     Conjunctiva/sclera: Conjunctivae normal.     Right eye: Right conjunctiva is not injected. No chemosis.    Left eye: Left conjunctiva is not injected. No chemosis.    Pupils: Pupils are equal, round, and reactive to light.  Cardiovascular:     Rate and Rhythm: Normal rate and regular rhythm.     Heart sounds: Normal heart sounds.  Pulmonary:     Effort: Pulmonary effort is normal. No tachypnea, accessory muscle usage or respiratory distress.     Breath sounds: Normal breath sounds. No wheezing, rhonchi or rales.     Comments: Moving air well in all lung fields. No increased work of breathing noted.  Chest:     Chest wall: No tenderness.  Lymphadenopathy:     Cervical: No cervical adenopathy.  Skin:    Coloration: Skin is not pale.     Findings: No abrasion,  erythema, petechiae or rash. Rash is not papular, urticarial or vesicular.  Neurological:     Mental Status: She is alert.  Psychiatric:        Behavior: Behavior is cooperative.      Diagnostic studies:    Spirometry: results normal (FEV1: 2.46/102%, FVC: 3.17/113%, FEV1/FVC: 78%).    Spirometry consistent with normal pattern.  Allergy Studies: none      Marty Shaggy, MD  [facility]        "

## 2024-10-05 NOTE — Patient Instructions (Addendum)
 1. Mild persistent asthma, uncomplicated - Lung testing looks good today.  - We are not going to make any changes at this point in time.  - Daily controller medication(s): Singulair  10mg  daily  - Prior to physical activity: Xopenex  2 puffs 10-15 minutes before physical activity. - Rescue medications: Xopenex  4 puffs every 4-6 hours as needed - Asthma control goals:  * Full participation in all desired activities (may need albuterol  before activity) * Albuterol  use two time or less a week on average (not counting use with activity) * Cough interfering with sleep two time or less a month * Oral steroids no more than once a year * No hospitalizations  2. Anaphylactic shock due to food (peanuts) - Continue to avoid peanuts. - EpiPen  is up to date.   3. Allergic rhinitis - Continue with the allergy shots at the same schedule.  - You are in the Red (maintenance), so you should definitely start feeling better especially next spring.  - Continue with Xyzal  and Allegra .  - Continue with Singulair  10mg  daily. - Continue with the Xhance  but increase to TWICE DAILY.  - Continue with Astelin  two sprays per nostril twice daily.  4. Follow up in 6 months or earlier if needed.    Please inform us  of any Emergency Department visits, hospitalizations, or changes in symptoms. Call us  before going to the ED for breathing or allergy symptoms since we might be able to fit you in for a sick visit. Feel free to contact us  anytime with any questions, problems, or concerns.  It was a pleasure to see you again today!  Websites that have reliable patient information: 1. American Academy of Asthma, Allergy, and Immunology: www.aaaai.org 2. Food Allergy Research and Education (FARE): foodallergy.org 3. Mothers of Asthmatics: http://www.asthmacommunitynetwork.org 4. American College of Allergy, Asthma, and Immunology: www.acaai.org      "Like" us  on Facebook and Instagram for our latest updates!       A healthy democracy works best when [employer] participate! Make sure you are registered to vote! If you have moved or changed any of your contact information, you will need to get this updated before voting! Scan the QR codes below to learn more!

## 2024-10-06 ENCOUNTER — Encounter: Payer: Self-pay | Admitting: Allergy & Immunology

## 2024-10-13 ENCOUNTER — Ambulatory Visit (INDEPENDENT_AMBULATORY_CARE_PROVIDER_SITE_OTHER)

## 2024-10-13 DIAGNOSIS — J309 Allergic rhinitis, unspecified: Secondary | ICD-10-CM
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
## 2024-10-16 ENCOUNTER — Encounter: Payer: Self-pay | Admitting: Adult Health

## 2024-10-16 ENCOUNTER — Ambulatory Visit: Admitting: Adult Health

## 2024-10-16 VITALS — BP 119/79 | HR 66 | Ht 60.0 in | Wt 161.0 lb

## 2024-10-16 DIAGNOSIS — G43119 Migraine with aura, intractable, without status migrainosus: Secondary | ICD-10-CM | POA: Diagnosis not present

## 2024-10-16 MED ORDER — NORTRIPTYLINE HCL 10 MG PO CAPS: 10.0000 mg | ORAL_CAPSULE | Freq: Every day | ORAL | 11 refills | Status: DC

## 2024-10-16 NOTE — Patient Instructions (Addendum)
 Your Plan:  Continue Qulipta  60mg  daily  Continue zonisamide  300mg  daily   Start nortriptyline 10mg  nightly for 1 week and please let me know if you are interested in further increasing or call earlier if any difficulty tolerating   Continue Ubrelvy  as needed for now - please let me know if this does not seem to work as well to abort migraine headache       Follow up in 6 months or call earlier if needed      Thank you for coming to see us  at [facility]. I hope we have been able to provide you high quality care today.  You may receive a patient satisfaction survey over the next few weeks. We would appreciate your feedback and comments so that we may continue to improve ourselves and the health of our patients.

## 2024-10-20 ENCOUNTER — Ambulatory Visit (INDEPENDENT_AMBULATORY_CARE_PROVIDER_SITE_OTHER)

## 2024-10-20 DIAGNOSIS — J309 Allergic rhinitis, unspecified: Secondary | ICD-10-CM

## 2024-11-10 ENCOUNTER — Ambulatory Visit (INDEPENDENT_AMBULATORY_CARE_PROVIDER_SITE_OTHER): Admitting: *Deleted

## 2024-11-10 DIAGNOSIS — J309 Allergic rhinitis, unspecified: Secondary | ICD-10-CM

## 2024-11-11 ENCOUNTER — Other Ambulatory Visit: Payer: Self-pay | Admitting: Adult Health

## 2024-11-16 ENCOUNTER — Ambulatory Visit (INDEPENDENT_AMBULATORY_CARE_PROVIDER_SITE_OTHER)

## 2024-11-16 DIAGNOSIS — J309 Allergic rhinitis, unspecified: Secondary | ICD-10-CM

## 2024-11-20 ENCOUNTER — Ambulatory Visit (INDEPENDENT_AMBULATORY_CARE_PROVIDER_SITE_OTHER)

## 2024-11-20 DIAGNOSIS — J309 Allergic rhinitis, unspecified: Secondary | ICD-10-CM

## 2024-11-29 ENCOUNTER — Ambulatory Visit: Admitting: Obstetrics and Gynecology

## 2024-11-29 ENCOUNTER — Encounter: Payer: Self-pay | Admitting: Obstetrics and Gynecology

## 2024-11-29 ENCOUNTER — Other Ambulatory Visit (HOSPITAL_COMMUNITY)
Admission: RE | Admit: 2024-11-29 | Discharge: 2024-11-29 | Disposition: A | Source: Ambulatory Visit | Attending: Obstetrics and Gynecology | Admitting: Obstetrics and Gynecology

## 2024-11-29 VITALS — BP 120/82 | HR 95 | Ht 60.0 in | Wt 169.0 lb

## 2024-11-29 DIAGNOSIS — Z113 Encounter for screening for infections with a predominantly sexual mode of transmission: Secondary | ICD-10-CM

## 2024-11-29 DIAGNOSIS — R102 Pelvic and perineal pain unspecified side: Secondary | ICD-10-CM | POA: Diagnosis not present

## 2024-11-29 DIAGNOSIS — Z01419 Encounter for gynecological examination (general) (routine) without abnormal findings: Secondary | ICD-10-CM | POA: Diagnosis not present

## 2024-11-29 DIAGNOSIS — Z1331 Encounter for screening for depression: Secondary | ICD-10-CM | POA: Diagnosis not present

## 2024-11-29 DIAGNOSIS — Z975 Presence of (intrauterine) contraceptive device: Secondary | ICD-10-CM

## 2024-11-29 DIAGNOSIS — N898 Other specified noninflammatory disorders of vagina: Secondary | ICD-10-CM | POA: Insufficient documentation
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
## 2024-11-29 NOTE — Patient Instructions (Signed)

## 2024-11-30 ENCOUNTER — Ambulatory Visit

## 2024-11-30 ENCOUNTER — Encounter: Payer: Self-pay | Admitting: *Deleted

## 2024-11-30 DIAGNOSIS — L219 Seborrheic dermatitis, unspecified: Secondary | ICD-10-CM | POA: Diagnosis not present

## 2024-11-30 DIAGNOSIS — J309 Allergic rhinitis, unspecified: Secondary | ICD-10-CM | POA: Diagnosis not present

## 2024-11-30 DIAGNOSIS — L700 Acne vulgaris: Secondary | ICD-10-CM | POA: Diagnosis not present

## 2024-11-30 NOTE — Progress Notes (Signed)
 Leianna Barga Phillippi                                          MRN: [id number]   11/30/2024   The [facility] Quality Team Specialist reviewed this patient medical record for the purposes of chart review for care gap closure. The following were reviewed: abstraction for care gap closure-cervical cancer screening.    [facility] Quality Team

## 2024-12-01 LAB — CERVICOVAGINAL ANCILLARY ONLY
Bacterial Vaginitis (gardnerella): NEGATIVE
Candida Glabrata: NEGATIVE
Candida Vaginitis: NEGATIVE
Chlamydia: NEGATIVE
Comment: NEGATIVE
Comment: NEGATIVE
Comment: NEGATIVE
Comment: NEGATIVE
Comment: NEGATIVE
Comment: NORMAL
Neisseria Gonorrhea: NEGATIVE
Trichomonas: NEGATIVE

## 2024-12-02 ENCOUNTER — Ambulatory Visit: Payer: Self-pay | Admitting: Obstetrics and Gynecology

## 2024-12-04 DIAGNOSIS — F909 Attention-deficit hyperactivity disorder, unspecified type: Secondary | ICD-10-CM | POA: Diagnosis not present

## 2024-12-05 ENCOUNTER — Other Ambulatory Visit: Payer: Self-pay | Admitting: Allergy & Immunology

## 2024-12-07 ENCOUNTER — Ambulatory Visit (INDEPENDENT_AMBULATORY_CARE_PROVIDER_SITE_OTHER)

## 2024-12-07 DIAGNOSIS — J309 Allergic rhinitis, unspecified: Secondary | ICD-10-CM

## 2024-12-18 ENCOUNTER — Ambulatory Visit

## 2024-12-18 DIAGNOSIS — J309 Allergic rhinitis, unspecified: Secondary | ICD-10-CM | POA: Diagnosis not present

## 2024-12-18 NOTE — Progress Notes (Signed)
 VIAL MADE ON 12/18/24

## 2024-12-19 DIAGNOSIS — J3089 Other allergic rhinitis: Secondary | ICD-10-CM | POA: Diagnosis not present

## 2024-12-19 DIAGNOSIS — J3081 Allergic rhinitis due to animal (cat) (dog) hair and dander: Secondary | ICD-10-CM | POA: Diagnosis not present

## 2024-12-19 DIAGNOSIS — J301 Allergic rhinitis due to pollen: Secondary | ICD-10-CM | POA: Diagnosis not present

## 2024-12-26 ENCOUNTER — Ambulatory Visit (INDEPENDENT_AMBULATORY_CARE_PROVIDER_SITE_OTHER): Admitting: *Deleted

## 2024-12-26 DIAGNOSIS — J309 Allergic rhinitis, unspecified: Secondary | ICD-10-CM

## 2025-01-05 ENCOUNTER — Ambulatory Visit

## 2025-01-05 DIAGNOSIS — J302 Other seasonal allergic rhinitis: Secondary | ICD-10-CM | POA: Diagnosis not present
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
## 2025-01-11 ENCOUNTER — Encounter: Payer: Self-pay | Admitting: Obstetrics and Gynecology

## 2025-01-11 ENCOUNTER — Ambulatory Visit (INDEPENDENT_AMBULATORY_CARE_PROVIDER_SITE_OTHER): Admitting: Obstetrics and Gynecology

## 2025-01-11 ENCOUNTER — Ambulatory Visit

## 2025-01-11 VITALS — BP 118/78 | HR 98

## 2025-01-11 DIAGNOSIS — N946 Dysmenorrhea, unspecified: Secondary | ICD-10-CM

## 2025-01-11 DIAGNOSIS — R102 Pelvic and perineal pain unspecified side: Secondary | ICD-10-CM

## 2025-01-11 DIAGNOSIS — J302 Other seasonal allergic rhinitis: Secondary | ICD-10-CM | POA: Diagnosis not present

## 2025-01-11 DIAGNOSIS — D251 Intramural leiomyoma of uterus: Secondary | ICD-10-CM

## 2025-01-11 DIAGNOSIS — Z975 Presence of (intrauterine) contraceptive device: Secondary | ICD-10-CM | POA: Diagnosis not present

## 2025-01-11 DIAGNOSIS — Z30431 Encounter for routine checking of intrauterine contraceptive device: Secondary | ICD-10-CM

## 2025-01-11 MED ORDER — IBUPROFEN 800 MG PO TABS: 800.0000 mg | ORAL_TABLET | Freq: Three times a day (TID) | ORAL | 2 refills | Status: AC | PRN

## 2025-01-11 MED ORDER — NORETHINDRONE 0.35 MG PO TABS: 1.0000 | ORAL_TABLET | Freq: Every day | ORAL | 0 refills | Status: AC

## 2025-01-11 NOTE — Patient Instructions (Signed)
 Norethindrone Tablets (Contraception) What is this medication? NORETHINDRONE (nor eth IN drone) prevents ovulation and pregnancy. It belongs to a group of medications called contraceptives. This medication is a progestin hormone. This medicine may be used for other purposes; ask your health care provider or pharmacist if you have questions. COMMON BRAND NAME(S): Camila, Deblitane 28-Day, [place], [place], [place], [place], [place], Nor-QD, Nora-BE, Norlyroc, Ortho Micronor, [employer] 28-Day What should I tell my care team before I take this medication? They need to know if you have any of these conditions: Blood vessel disease or blood clots Breast, cervical, or vaginal cancer Diabetes Heart disease Kidney disease Liver disease Mental depression Migraine Seizures Stroke Vaginal bleeding An unusual or allergic reaction to norethindrone, other medications, foods, dyes, or preservatives Pregnant or trying to get pregnant Breast-feeding How should I use this medication? Take this medication by mouth with a glass of water. You may take it with or without food. Follow the directions on the prescription label. Take this medication at the same time each day and in the order directed on the package. Do not take your medication more often than directed. Contact your care team about the use of this medication in children. Special care may be needed. This medication has been used in female children who have started having menstrual periods. A patient package insert for the product will be given with each prescription and refill. Read this sheet carefully each time. The sheet may change frequently. Overdosage: If you think you have taken too much of this medicine contact a poison control center or emergency room at once. NOTE: This medicine is only for you. Do not share this medicine with others. What if I miss a dose? If you miss a dose, refer to the patient package insert for instruction. This  medication may not work as well if you miss more than 1 pill. You may need to use back-up contraception. What may interact with this medication? Do not take this medication with any of the following: Amprenavir or fosamprenavir Bosentan This medication may also interact with the following: Antibiotics or medications for infections, especially rifampin, rifabutin, rifapentine, and griseofulvin, and possibly penicillins or tetracyclines Aprepitant Barbiturate medications, such as phenobarbital Carbamazepine Felbamate Modafinil Oxcarbazepine Phenytoin Ritonavir or other medications for HIV infection or AIDS St. John's wort Topiramate This list may not describe all possible interactions. Give your health care provider a list of all the medicines, herbs, non-prescription drugs, or dietary supplements you use. Also tell them if you smoke, drink alcohol, or use illegal drugs. Some items may interact with your medicine. What should I watch for while using this medication? Visit your care team for regular health checks while on this medication. You may need to use another form of contraception, such as a condom, during the first cycle of this medication. If you may be pregnant, stop taking this medication right away and contact your care team. What side effects may I notice from receiving this medication? Side effects that you should report to your care team as soon as possible: Allergic reactions--skin rash, itching, hives, swelling of the face, lips, tongue, or throat Blood clot--pain, swelling, or warmth in the leg, shortness of breath, chest pain Gallbladder problems--severe stomach pain, nausea, vomiting, fever Increase in blood pressure Liver injury--right upper belly pain, loss of appetite, nausea, light-colored stool, dark yellow or brown urine, yellowing skin or eyes, unusual weakness or fatigue New or worsening migraines or headaches Stroke--sudden numbness or weakness of the face,  arm, or leg,  trouble speaking, confusion, trouble walking, loss of balance or coordination, dizziness, severe headache, change in vision Unusual vaginal discharge, itching, or odor Worsening mood, feelings of depression Side effects that usually do not require medical attention (report to your care team if they continue or are bothersome): Breast pain or tenderness Dark patches of skin on the face or other sun-exposed areas Irregular menstrual cycles or spotting Nausea Weight gain This list may not describe all possible side effects. Call your doctor for medical advice about side effects. You may report side effects to FDA at 1-800-FDA-1088. Where should I keep my medication? Keep out of the reach of children and pets. Store at room temperature between 15 and 30 degrees C (59 and 86 degrees F). Throw away any unused medication after the expiration date. NOTE: This sheet is a summary. It may not cover all possible information. If you have questions about this medicine, talk to your doctor, pharmacist, or health care provider.  2024 Elsevier/Gold Standard (2023-05-21 00:00:00)

## 2025-01-16 ENCOUNTER — Other Ambulatory Visit (HOSPITAL_COMMUNITY): Payer: Self-pay

## 2025-01-16 ENCOUNTER — Telehealth: Payer: Self-pay

## 2025-01-16 DIAGNOSIS — J329 Chronic sinusitis, unspecified: Secondary | ICD-10-CM

## 2025-01-16 NOTE — Telephone Encounter (Signed)
*  AA  Pharmacy Patient Advocate Encounter   Received notification from Fax that prior authorization for Xhance  is required/requested.   Insurance verification completed.   The patient is insured through [facility].   Per test claim: PA required; PA started via CoverMyMeds. KEY [id number] . Please see clinical question(s) below that I am not finding the answer to in their chart and advise.   Patient diagnosis shows allergic rhinitis, Xhance  is covered for patients with chronic rhinosinusitis

## 2025-01-16 NOTE — Telephone Encounter (Signed)
 I am fine adding on that  to her diagnosis list.  Marty Shaggy, MD [facility] 

## 2025-01-16 NOTE — Telephone Encounter (Signed)
 Can you please update that to your last note?

## 2025-01-17 ENCOUNTER — Ambulatory Visit

## 2025-01-17 DIAGNOSIS — J302 Other seasonal allergic rhinitis: Secondary | ICD-10-CM | POA: Diagnosis not present

## 2025-01-17 NOTE — Telephone Encounter (Signed)
 Done. Thanks.

## 2025-01-18 NOTE — Telephone Encounter (Signed)
 Submitted to plan, pending determination.

## 2025-01-18 NOTE — Telephone Encounter (Signed)
 Your request has been approved Approved. Authorization Expiration01/22/2027

## 2025-01-26 ENCOUNTER — Ambulatory Visit

## 2025-01-26 DIAGNOSIS — J302 Other seasonal allergic rhinitis: Secondary | ICD-10-CM

## 2025-04-05 ENCOUNTER — Ambulatory Visit: Admitting: Allergy & Immunology

## 2025-04-26 ENCOUNTER — Ambulatory Visit: Admitting: Adult Health

## 2025-12-03 ENCOUNTER — Ambulatory Visit: Admitting: Obstetrics and Gynecology
